# Patient Record
Sex: Female | Born: 1950
Health system: Southern US, Community
[De-identification: ages and names within clinical notes are randomized; demographics above are authoritative.]

## PROBLEM LIST (undated history)

## (undated) DIAGNOSIS — I82409 Acute embolism and thrombosis of unspecified deep veins of unspecified lower extremity: Secondary | ICD-10-CM

## (undated) DIAGNOSIS — D0359 Melanoma in situ of other part of trunk: Secondary | ICD-10-CM

## (undated) DIAGNOSIS — I10 Essential (primary) hypertension: Secondary | ICD-10-CM

## (undated) DIAGNOSIS — D219 Benign neoplasm of connective and other soft tissue, unspecified: Secondary | ICD-10-CM

## (undated) DIAGNOSIS — D4959 Neoplasm of unspecified behavior of other genitourinary organ: Secondary | ICD-10-CM

## (undated) DIAGNOSIS — C801 Malignant (primary) neoplasm, unspecified: Secondary | ICD-10-CM

## (undated) HISTORY — PX: SKIN SURGERY: SHX2413

## (undated) HISTORY — DX: Essential (primary) hypertension: I10

## (undated) HISTORY — PX: OTHER SURGICAL HISTORY: SHX169

## (undated) HISTORY — DX: Neoplasm of unspecified behavior of other genitourinary organ: D49.59

## (undated) HISTORY — PX: OVARIAN CYST REMOVAL: SHX89

## (undated) HISTORY — DX: Acute embolism and thrombosis of unspecified deep veins of unspecified lower extremity: I82.409

## (undated) HISTORY — DX: Benign neoplasm of connective and other soft tissue, unspecified: D21.9

## (undated) HISTORY — PX: FACIAL COSMETIC SURGERY: SHX629

## (undated) HISTORY — PX: BACK SURGERY: SHX140

## (undated) HISTORY — DX: Malignant (primary) neoplasm, unspecified: C80.1

## (undated) HISTORY — DX: Melanoma in situ of other part of trunk: D03.59

## (undated) HISTORY — PX: BREAST SURGERY: SHX581

---

## 1997-12-17 ENCOUNTER — Other Ambulatory Visit: Admission: RE | Admit: 1997-12-17 | Discharge: 1997-12-17 | Payer: Self-pay | Admitting: Gynecology

## 1998-10-14 ENCOUNTER — Other Ambulatory Visit: Admission: RE | Admit: 1998-10-14 | Discharge: 1998-10-14 | Payer: Self-pay | Admitting: Gynecology

## 1998-12-27 ENCOUNTER — Encounter: Payer: Self-pay | Admitting: Cardiovascular Disease

## 1998-12-27 ENCOUNTER — Ambulatory Visit (HOSPITAL_COMMUNITY): Admission: RE | Admit: 1998-12-27 | Discharge: 1998-12-27 | Payer: Self-pay | Admitting: Cardiovascular Disease

## 1999-07-01 ENCOUNTER — Encounter: Payer: Self-pay | Admitting: Gynecology

## 1999-07-01 ENCOUNTER — Ambulatory Visit (HOSPITAL_COMMUNITY): Admission: RE | Admit: 1999-07-01 | Discharge: 1999-07-01 | Payer: Self-pay | Admitting: Gynecology

## 1999-07-07 ENCOUNTER — Encounter: Payer: Self-pay | Admitting: Gynecology

## 1999-07-07 ENCOUNTER — Ambulatory Visit (HOSPITAL_COMMUNITY): Admission: RE | Admit: 1999-07-07 | Discharge: 1999-07-07 | Payer: Self-pay | Admitting: Gynecology

## 1999-08-28 ENCOUNTER — Other Ambulatory Visit: Admission: RE | Admit: 1999-08-28 | Discharge: 1999-08-28 | Payer: Self-pay | Admitting: Gynecology

## 2000-02-19 ENCOUNTER — Other Ambulatory Visit: Admission: RE | Admit: 2000-02-19 | Discharge: 2000-02-19 | Payer: Self-pay | Admitting: Gynecology

## 2000-06-17 ENCOUNTER — Ambulatory Visit (HOSPITAL_COMMUNITY): Admission: RE | Admit: 2000-06-17 | Discharge: 2000-06-17 | Payer: Self-pay | Admitting: Gynecology

## 2000-06-17 ENCOUNTER — Encounter: Payer: Self-pay | Admitting: Gynecology

## 2000-09-20 ENCOUNTER — Other Ambulatory Visit: Admission: RE | Admit: 2000-09-20 | Discharge: 2000-09-20 | Payer: Self-pay | Admitting: Gynecology

## 2001-06-20 ENCOUNTER — Encounter: Payer: Self-pay | Admitting: Gynecology

## 2001-06-20 ENCOUNTER — Ambulatory Visit (HOSPITAL_COMMUNITY): Admission: RE | Admit: 2001-06-20 | Discharge: 2001-06-20 | Payer: Self-pay | Admitting: Gynecology

## 2001-09-14 ENCOUNTER — Other Ambulatory Visit: Admission: RE | Admit: 2001-09-14 | Discharge: 2001-09-14 | Payer: Self-pay | Admitting: Gynecology

## 2002-06-23 ENCOUNTER — Ambulatory Visit (HOSPITAL_COMMUNITY): Admission: RE | Admit: 2002-06-23 | Discharge: 2002-06-23 | Payer: Self-pay | Admitting: Gynecology

## 2002-06-23 ENCOUNTER — Encounter: Payer: Self-pay | Admitting: Gynecology

## 2002-06-27 ENCOUNTER — Encounter: Admission: RE | Admit: 2002-06-27 | Discharge: 2002-06-27 | Payer: Self-pay | Admitting: Gynecology

## 2002-06-27 ENCOUNTER — Encounter: Payer: Self-pay | Admitting: Gynecology

## 2002-09-18 ENCOUNTER — Other Ambulatory Visit: Admission: RE | Admit: 2002-09-18 | Discharge: 2002-09-18 | Payer: Self-pay | Admitting: Gynecology

## 2003-07-16 ENCOUNTER — Ambulatory Visit (HOSPITAL_COMMUNITY): Admission: RE | Admit: 2003-07-16 | Discharge: 2003-07-16 | Payer: Self-pay | Admitting: Gynecology

## 2003-09-24 ENCOUNTER — Other Ambulatory Visit: Admission: RE | Admit: 2003-09-24 | Discharge: 2003-09-24 | Payer: Self-pay | Admitting: Gynecology

## 2004-07-30 ENCOUNTER — Ambulatory Visit (HOSPITAL_COMMUNITY): Admission: RE | Admit: 2004-07-30 | Discharge: 2004-07-30 | Payer: Self-pay | Admitting: Gynecology

## 2004-09-03 ENCOUNTER — Encounter: Admission: RE | Admit: 2004-09-03 | Discharge: 2004-09-03 | Payer: Self-pay | Admitting: Gynecology

## 2004-10-11 ENCOUNTER — Encounter: Admission: RE | Admit: 2004-10-11 | Discharge: 2004-10-11 | Payer: Self-pay | Admitting: Gynecology

## 2005-01-26 ENCOUNTER — Other Ambulatory Visit: Admission: RE | Admit: 2005-01-26 | Discharge: 2005-01-26 | Payer: Self-pay | Admitting: Gynecology

## 2005-04-03 ENCOUNTER — Ambulatory Visit: Payer: Self-pay | Admitting: Infectious Diseases

## 2006-08-23 ENCOUNTER — Ambulatory Visit (HOSPITAL_COMMUNITY): Admission: RE | Admit: 2006-08-23 | Discharge: 2006-08-23 | Payer: Self-pay | Admitting: Gynecology

## 2007-08-26 ENCOUNTER — Ambulatory Visit (HOSPITAL_COMMUNITY): Admission: RE | Admit: 2007-08-26 | Discharge: 2007-08-26 | Payer: Self-pay | Admitting: Gynecology

## 2008-08-27 ENCOUNTER — Ambulatory Visit (HOSPITAL_COMMUNITY): Admission: RE | Admit: 2008-08-27 | Discharge: 2008-08-27 | Payer: Self-pay | Admitting: Gynecology

## 2008-09-03 ENCOUNTER — Encounter: Admission: RE | Admit: 2008-09-03 | Discharge: 2008-09-03 | Payer: Self-pay | Admitting: Gynecology

## 2009-08-10 DIAGNOSIS — D4959 Neoplasm of unspecified behavior of other genitourinary organ: Secondary | ICD-10-CM

## 2009-08-10 HISTORY — DX: Neoplasm of unspecified behavior of other genitourinary organ: D49.59

## 2009-09-06 ENCOUNTER — Ambulatory Visit (HOSPITAL_COMMUNITY): Admission: RE | Admit: 2009-09-06 | Discharge: 2009-09-06 | Payer: Self-pay | Admitting: Gynecology

## 2009-12-27 ENCOUNTER — Encounter: Admission: RE | Admit: 2009-12-27 | Discharge: 2009-12-27 | Payer: Self-pay | Admitting: Gynecology

## 2009-12-31 ENCOUNTER — Ambulatory Visit: Admission: RE | Admit: 2009-12-31 | Discharge: 2009-12-31 | Payer: Self-pay | Admitting: Gynecologic Oncology

## 2010-01-14 ENCOUNTER — Encounter: Payer: Self-pay | Admitting: Obstetrics & Gynecology

## 2010-01-14 ENCOUNTER — Inpatient Hospital Stay (HOSPITAL_COMMUNITY): Admission: RE | Admit: 2010-01-14 | Discharge: 2010-01-17 | Payer: Self-pay | Admitting: Gynecology

## 2010-02-13 ENCOUNTER — Ambulatory Visit: Admission: RE | Admit: 2010-02-13 | Discharge: 2010-02-13 | Payer: Self-pay | Admitting: Gynecologic Oncology

## 2010-06-05 ENCOUNTER — Ambulatory Visit: Admission: RE | Admit: 2010-06-05 | Discharge: 2010-06-05 | Payer: Self-pay | Admitting: Gynecologic Oncology

## 2010-08-31 ENCOUNTER — Encounter: Payer: Self-pay | Admitting: Gynecology

## 2010-09-08 ENCOUNTER — Ambulatory Visit (HOSPITAL_COMMUNITY)
Admission: RE | Admit: 2010-09-08 | Discharge: 2010-09-08 | Payer: Self-pay | Source: Home / Self Care | Attending: Gynecology | Admitting: Gynecology

## 2010-10-27 LAB — CBC
HCT: 33.6 % — ABNORMAL LOW (ref 36.0–46.0)
HCT: 34.9 % — ABNORMAL LOW (ref 36.0–46.0)
HCT: 42.5 % (ref 36.0–46.0)
Hemoglobin: 11.8 g/dL — ABNORMAL LOW (ref 12.0–15.0)
Hemoglobin: 12.4 g/dL (ref 12.0–15.0)
Hemoglobin: 15 g/dL (ref 12.0–15.0)
MCHC: 34.9 g/dL (ref 30.0–36.0)
MCHC: 35.4 g/dL (ref 30.0–36.0)
MCHC: 35.6 g/dL (ref 30.0–36.0)
MCV: 87.2 fL (ref 78.0–100.0)
MCV: 87.6 fL (ref 78.0–100.0)
MCV: 87.9 fL (ref 78.0–100.0)
Platelets: 168 10*3/uL (ref 150–400)
Platelets: 195 10*3/uL (ref 150–400)
Platelets: 195 10*3/uL (ref 150–400)
RBC: 3.83 MIL/uL — ABNORMAL LOW (ref 3.87–5.11)
RBC: 3.98 MIL/uL (ref 3.87–5.11)
RBC: 4.87 MIL/uL (ref 3.87–5.11)
RDW: 11.6 % (ref 11.5–15.5)
RDW: 11.8 % (ref 11.5–15.5)
RDW: 12 % (ref 11.5–15.5)
WBC: 12.9 10*3/uL — ABNORMAL HIGH (ref 4.0–10.5)
WBC: 21.6 10*3/uL — ABNORMAL HIGH (ref 4.0–10.5)
WBC: 7.8 10*3/uL (ref 4.0–10.5)

## 2010-10-27 LAB — COMPREHENSIVE METABOLIC PANEL
ALT: 19 U/L (ref 0–35)
AST: 17 U/L (ref 0–37)
Albumin: 4.6 g/dL (ref 3.5–5.2)
Alkaline Phosphatase: 78 U/L (ref 39–117)
BUN: 14 mg/dL (ref 6–23)
CO2: 27 mEq/L (ref 19–32)
Calcium: 9.4 mg/dL (ref 8.4–10.5)
Chloride: 104 mEq/L (ref 96–112)
Creatinine, Ser: 0.77 mg/dL (ref 0.4–1.2)
GFR calc Af Amer: >60 mL/min (ref >60)
GFR calc non Af Amer: >60 mL/min (ref >60)
Glucose, Bld: 108 mg/dL — ABNORMAL HIGH (ref 70–99)
Potassium: 3.6 mEq/L (ref 3.5–5.1)
Sodium: 139 mEq/L (ref 135–145)
Total Bilirubin: 1.1 mg/dL (ref 0.3–1.2)
Total Protein: 7.7 g/dL (ref 6.0–8.3)

## 2010-10-27 LAB — BASIC METABOLIC PANEL
BUN: 7 mg/dL (ref 6–23)
BUN: 8 mg/dL (ref 6–23)
CO2: 27 mEq/L (ref 19–32)
CO2: 27 mEq/L (ref 19–32)
Calcium: 8.9 mg/dL (ref 8.4–10.5)
Calcium: 9 mg/dL (ref 8.4–10.5)
Chloride: 107 mEq/L (ref 96–112)
Chloride: 108 mEq/L (ref 96–112)
Creatinine, Ser: 0.78 mg/dL (ref 0.4–1.2)
Creatinine, Ser: 0.88 mg/dL (ref 0.4–1.2)
GFR calc Af Amer: >60 mL/min (ref >60)
GFR calc Af Amer: >60 mL/min (ref >60)
GFR calc non Af Amer: >60 mL/min (ref >60)
GFR calc non Af Amer: >60 mL/min (ref >60)
Glucose, Bld: 114 mg/dL — ABNORMAL HIGH (ref 70–99)
Glucose, Bld: 177 mg/dL — ABNORMAL HIGH (ref 70–99)
Potassium: 3.8 mEq/L (ref 3.5–5.1)
Potassium: 4.8 mEq/L (ref 3.5–5.1)
Sodium: 139 mEq/L (ref 135–145)
Sodium: 140 mEq/L (ref 135–145)

## 2010-10-27 LAB — DIFFERENTIAL
Basophils Absolute: 0.1 10*3/uL (ref 0.0–0.1)
Basophils Relative: 1 % (ref 0–1)
Eosinophils Absolute: 0.1 10*3/uL (ref 0.0–0.7)
Eosinophils Relative: 1 % (ref 0–5)
Lymphocytes Relative: 43 % (ref 12–46)
Lymphs Abs: 3.3 10*3/uL (ref 0.7–4.0)
Monocytes Absolute: 0.6 10*3/uL (ref 0.1–1.0)
Monocytes Relative: 7 % (ref 3–12)
Neutro Abs: 3.8 10*3/uL (ref 1.7–7.7)
Neutrophils Relative %: 48 % (ref 43–77)

## 2010-10-27 LAB — ABO/RH: ABO/RH(D): O POS

## 2010-10-27 LAB — TYPE AND SCREEN
ABO/RH(D): O POS
Antibody Screen: NEGATIVE

## 2010-12-29 ENCOUNTER — Other Ambulatory Visit: Payer: Self-pay | Admitting: Gynecology

## 2011-07-22 ENCOUNTER — Ambulatory Visit: Payer: BC Managed Care – PPO | Attending: Gynecologic Oncology | Admitting: Gynecologic Oncology

## 2011-07-22 ENCOUNTER — Encounter: Payer: Self-pay | Admitting: Gynecologic Oncology

## 2011-07-22 VITALS — BP 114/52 | HR 62 | Temp 98.1°F | Resp 16 | Ht 60.83 in | Wt 156.3 lb

## 2011-07-22 DIAGNOSIS — D3910 Neoplasm of uncertain behavior of unspecified ovary: Secondary | ICD-10-CM

## 2011-07-22 DIAGNOSIS — Z9079 Acquired absence of other genital organ(s): Secondary | ICD-10-CM | POA: Insufficient documentation

## 2011-07-22 DIAGNOSIS — Z79899 Other long term (current) drug therapy: Secondary | ICD-10-CM | POA: Insufficient documentation

## 2011-07-22 DIAGNOSIS — D279 Benign neoplasm of unspecified ovary: Secondary | ICD-10-CM | POA: Insufficient documentation

## 2011-07-22 DIAGNOSIS — Z90710 Acquired absence of both cervix and uterus: Secondary | ICD-10-CM | POA: Insufficient documentation

## 2011-07-22 DIAGNOSIS — Z85820 Personal history of malignant melanoma of skin: Secondary | ICD-10-CM | POA: Insufficient documentation

## 2011-07-22 DIAGNOSIS — I10 Essential (primary) hypertension: Secondary | ICD-10-CM | POA: Insufficient documentation

## 2011-07-22 NOTE — Progress Notes (Signed)
Consult Note: Gyn-Onc  Kristina Whitaker 60 y.o. female  CC:  Chief Complaint  Patient presents with  . Follow-up    LMP tumor of Ovary    HPI: This is a 60 year old who is referred to [place] by Dr. Greta Doom for pelvic mass which was evaluated and revealed a 10 cm solid mass noted on imaging. On 01/14/2010, she underwent total abdominal hysterectomy bilateral salpingo-oophorectomy and omentectomy. 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  We last saw her October of 2011. Her exam at that time was negative. Her CA 125 is not informative. She cytisine note in August of 2012 stating that there was a 2 mm spot in her lungs. She had a CT scan of the chest done as her husband has been diagnosed with lung cancer that was operable and she has a personal history of melanoma. The decision was to follow this up with a CT scan in 6 months. She is currently scheduled for CT scan of February 2013. This will be followed by her local physician and she will have the report sent to [place]. Interval History:  She continues to be very busy and traveling to [place] in [place]. About 2 weeks ago she was diagnosed with hypertension. He needed to call the ambulance for her place of employment clinic. She states that she has a lot of stress at work. Since that time she has been exercising more she is walking about a mile a day she is eating better and she feels that she has lost about 5 pounds and feels that her blood pressure is markedly improved. She has a -10 point review of systems. Review of Systems She denies any chest pain shortness of breath nausea vomiting fevers chills headaches visual changes. Denies any unintentional weight loss or weight gain. Any change in bowel bladder habits or early satiety. Current Meds:  Outpatient Encounter Prescriptions as of 07/22/2011  Medication Sig Dispense Refill  . fish oil-omega-3 fatty acids 1000 MG  capsule Take 1,000 mg by mouth daily.        . rosuvastatin (CRESTOR) 10 MG tablet Take 10 mg by mouth daily.          Allergy:  Allergies  Allergen Reactions  . Penicillins Hives  . Sulfa Drugs Cross Reactors Hives    Social Hx:   History   Social History  . Marital Status: Married    Spouse Name: N/A    Number of Children: N/A  . Years of Education: N/A   Occupational History  . Not on file.   Social History Main Topics  . Smoking status: Never Smoker   . Smokeless tobacco: Not on file  . Alcohol Use: 0.6 oz/week    1 Glasses of wine per week  . Drug Use:   . Sexually Active: No   Other Topics Concern  . Not on file   Social History Narrative  . No narrative on file    Past Surgical Hx:  Past Surgical History  Procedure Date  . Facial cosmetic surgery     2006  . Ovarian cyst removal     x2 with rupture x2 in her 60 and 40s  . Breast surgery     bilateral fibroadenomas/ cyst aspiration  . Back surgery     melanoma exicision from back    Past Medical Hx:  Past Medical History  Diagnosis Date  . Fibroids  uterine around age 60  . Melanoma in situ of back     1991  . Ovarian tumor 2011    LMP  . Hypertension   . Cancer     Family Hx:  Family History  Problem Relation Age of Onset  . Stroke Father   . Heart attack Sister   . Stroke Sister   . Heart attack Brother   . Heart attack Maternal Aunt   . Heart attack Paternal Uncle     Vitals:  Blood pressure 114/52, pulse 62, temperature 98.1 F (36.7 C), temperature source Oral, resp. rate 16, height 5' 0.83" (1.545 m), weight 156 lb 4.8 oz (70.897 kg).  Physical Exam: Well-nourished well-developed female in no acute distress. Neck is supple there is no lymphadenopathy no thyromegaly.  Lungs: Clear to auscultation bilaterally.  Cardiovascular exam: Regular rate and rhythm.  Abdomen: Well-healed vertical midline incision. There is no evidence of an incisional hernia. Abdomen is soft,  nontender, there are no palpable masses.  Groins: There is no lymphadenopathy.  Pelvic: External genitalia within normal limits. Bimanual examination reveals no masses or nodularity. Rectal confirms.   Extremities: No edema.  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 She'll followup with her primary physician in 6 months and she'll return to see [place] in one year. We have asked her to forward the note of her CT scan results from February to [place] so we can her file complete. She is not really sure what the most accurate medications are and she has promised to contact us with her medication list which she gets a  Ceferino Lang A., MD 07/22/2011, 3:16 PM

## 2011-07-22 NOTE — Patient Instructions (Signed)
RTC one year

## 2011-08-24 ENCOUNTER — Other Ambulatory Visit (HOSPITAL_COMMUNITY): Payer: Self-pay | Admitting: Gynecology

## 2011-08-24 DIAGNOSIS — Z1231 Encounter for screening mammogram for malignant neoplasm of breast: Secondary | ICD-10-CM

## 2011-10-05 ENCOUNTER — Ambulatory Visit (HOSPITAL_COMMUNITY)
Admission: RE | Admit: 2011-10-05 | Discharge: 2011-10-05 | Disposition: A | Discharge status: Home / Self Care | Payer: BC Managed Care – PPO | Source: Ambulatory Visit | Attending: Gynecology | Admitting: Gynecology

## 2011-10-05 DIAGNOSIS — Z1231 Encounter for screening mammogram for malignant neoplasm of breast: Secondary | ICD-10-CM | POA: Insufficient documentation

## 2011-12-08 ENCOUNTER — Other Ambulatory Visit: Payer: Self-pay | Admitting: *Deleted

## 2011-12-08 DIAGNOSIS — Z1211 Encounter for screening for malignant neoplasm of colon: Secondary | ICD-10-CM

## 2011-12-21 ENCOUNTER — Ambulatory Visit
Admission: RE | Admit: 2011-12-21 | Discharge: 2011-12-21 | Disposition: A | Discharge status: Home / Self Care | Payer: BC Managed Care – PPO | Source: Ambulatory Visit | Attending: Physician Assistant | Admitting: Physician Assistant

## 2011-12-21 DIAGNOSIS — Z1211 Encounter for screening for malignant neoplasm of colon: Secondary | ICD-10-CM

## 2012-09-09 ENCOUNTER — Other Ambulatory Visit (HOSPITAL_COMMUNITY): Payer: Self-pay | Admitting: Gynecology

## 2012-09-09 DIAGNOSIS — Z1231 Encounter for screening mammogram for malignant neoplasm of breast: Secondary | ICD-10-CM

## 2012-10-07 ENCOUNTER — Ambulatory Visit (HOSPITAL_COMMUNITY)
Admission: RE | Admit: 2012-10-07 | Discharge: 2012-10-07 | Disposition: A | Discharge status: Home / Self Care | Payer: BC Managed Care – PPO | Source: Ambulatory Visit | Attending: Gynecology | Admitting: Gynecology

## 2012-10-07 DIAGNOSIS — Z1231 Encounter for screening mammogram for malignant neoplasm of breast: Secondary | ICD-10-CM | POA: Insufficient documentation

## 2012-11-10 ENCOUNTER — Other Ambulatory Visit: Payer: Self-pay | Admitting: Nurse Practitioner

## 2012-11-12 ENCOUNTER — Other Ambulatory Visit: Payer: Self-pay | Admitting: Nurse Practitioner

## 2013-01-26 ENCOUNTER — Other Ambulatory Visit: Payer: Self-pay | Admitting: Nurse Practitioner

## 2013-01-27 NOTE — Telephone Encounter (Signed)
Last seen [facility] 11/18/11

## 2013-02-21 ENCOUNTER — Other Ambulatory Visit: Payer: Self-pay | Admitting: Nurse Practitioner

## 2013-06-12 ENCOUNTER — Other Ambulatory Visit: Payer: Self-pay | Admitting: Nurse Practitioner

## 2013-07-31 ENCOUNTER — Other Ambulatory Visit: Payer: Self-pay | Admitting: Nurse Practitioner

## 2013-08-10 DIAGNOSIS — I82409 Acute embolism and thrombosis of unspecified deep veins of unspecified lower extremity: Secondary | ICD-10-CM

## 2013-08-10 HISTORY — DX: Acute embolism and thrombosis of unspecified deep veins of unspecified lower extremity: I82.409

## 2013-09-25 ENCOUNTER — Other Ambulatory Visit: Payer: Self-pay | Admitting: Nurse Practitioner

## 2014-01-25 ENCOUNTER — Other Ambulatory Visit (HOSPITAL_COMMUNITY): Payer: Self-pay | Admitting: Gynecology

## 2014-01-25 DIAGNOSIS — Z1231 Encounter for screening mammogram for malignant neoplasm of breast: Secondary | ICD-10-CM

## 2014-01-26 ENCOUNTER — Ambulatory Visit (HOSPITAL_COMMUNITY)
Admission: RE | Admit: 2014-01-26 | Discharge: 2014-01-26 | Disposition: A | Discharge status: Home / Self Care | Payer: BC Managed Care – PPO | Source: Ambulatory Visit | Attending: Gynecology | Admitting: Gynecology

## 2014-01-26 DIAGNOSIS — Z1231 Encounter for screening mammogram for malignant neoplasm of breast: Secondary | ICD-10-CM | POA: Insufficient documentation

## 2014-09-24 ENCOUNTER — Ambulatory Visit: Payer: Self-pay | Admitting: Gynecologic Oncology

## 2014-10-03 ENCOUNTER — Ambulatory Visit: Payer: No Typology Code available for payment source | Attending: Gynecologic Oncology | Admitting: Gynecologic Oncology

## 2014-10-03 ENCOUNTER — Encounter: Payer: Self-pay | Admitting: Gynecologic Oncology

## 2014-10-03 ENCOUNTER — Ambulatory Visit: Payer: PRIVATE HEALTH INSURANCE

## 2014-10-03 ENCOUNTER — Telehealth: Payer: Self-pay | Admitting: *Deleted

## 2014-10-03 VITALS — BP 145/59 | HR 78 | Temp 98.0°F | Resp 20 | Ht 60.0 in | Wt 154.7 lb

## 2014-10-03 DIAGNOSIS — I10 Essential (primary) hypertension: Secondary | ICD-10-CM | POA: Insufficient documentation

## 2014-10-03 DIAGNOSIS — R600 Localized edema: Secondary | ICD-10-CM | POA: Diagnosis not present

## 2014-10-03 DIAGNOSIS — D495 Neoplasm of unspecified behavior of other genitourinary organs: Secondary | ICD-10-CM | POA: Insufficient documentation

## 2014-10-03 DIAGNOSIS — D3912 Neoplasm of uncertain behavior of left ovary: Secondary | ICD-10-CM | POA: Diagnosis not present

## 2014-10-03 DIAGNOSIS — D3910 Neoplasm of uncertain behavior of unspecified ovary: Secondary | ICD-10-CM

## 2014-10-03 DIAGNOSIS — Z86711 Personal history of pulmonary embolism: Secondary | ICD-10-CM

## 2014-10-03 DIAGNOSIS — Z86718 Personal history of other venous thrombosis and embolism: Secondary | ICD-10-CM

## 2014-10-03 DIAGNOSIS — R911 Solitary pulmonary nodule: Secondary | ICD-10-CM

## 2014-10-03 DIAGNOSIS — E785 Hyperlipidemia, unspecified: Secondary | ICD-10-CM | POA: Insufficient documentation

## 2014-10-03 DIAGNOSIS — R079 Chest pain, unspecified: Secondary | ICD-10-CM | POA: Insufficient documentation

## 2014-10-03 DIAGNOSIS — I82402 Acute embolism and thrombosis of unspecified deep veins of left lower extremity: Secondary | ICD-10-CM

## 2014-10-03 DIAGNOSIS — I82409 Acute embolism and thrombosis of unspecified deep veins of unspecified lower extremity: Secondary | ICD-10-CM | POA: Insufficient documentation

## 2014-10-03 NOTE — Telephone Encounter (Signed)
Received phone call from patient after her office visit today. Patient states "I'm not giving my blood away so cancel my CT scan." Patient refused to have BMET drawn prior to scan. Reiterated to patient why we needed lab test done prior to scan. She states "It doesn't matter, I am not doing the scan anyway." Attempted to tell the patient to please call our clinic back if she changes her mind - patient did not comment back and hung up the phone.  CT scan cancelled.

## 2014-10-03 NOTE — Progress Notes (Signed)
Kristina Whitaker 64 y.o. Female here for followup of her IA LMP of the left ovary  CC:  Chief Complaint  Patient presents with  . Follow-up    LMP tumor of Ovary    HPI: This is a 64 year old who was originally referred to [place] by Dr. Gertie Fey for pelvic mass which was evaluated and revealed a 10 cm solid mass noted on imaging. She has not seen [place] since 07/2011.  On 01/14/2010, she underwent total abdominal hysterectomy bilateral salpingo-oophorectomy and omentectomy. 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 At the time of her last exam in December, 2012 her exam was negative. Her CA 125 is not informative.   There is mention in the notes at that time that in August of 2012 she reported having a CT of the chest that demonstrated was a 2 mm spot in her lungs. She had a CT scan of the chest done as her husband has been diagnosed with lung cancer that was operable and she has a personal history of melanoma. The decision was to follow this up with a CT scan in 6 months. Dr Alycia Rossetti had mentioned that she was scheduled for CT scan of February 2013. However, the patient strongly refutes this history to me today and reports that she has never had a CT of the chest. She is deeply anxious regarding my report of the 65mm nodule on the lung.    Interval History:  She continues to be very busy and traveling internationally and nationally. She does report having a diagnosis of a left DVT and pulmonary embolism in 2015 that was treated with Xarelto for 6 months. She is now no longer on Xarelto. She reports will 41 week having increased left lower extremity edema but to her feels consistent with a new blood clot. She went to [facility] for duplex imaging of the lower extremity which the patient reports was negative for a blood clot this to placed last week. I do not have the report from this imaging. She does report no  abdominal bloating (other than when she eats certain foods such as sugar) but does report normal bowel habit. She denies new abdominal pelvic pains.  Review of Systems She denies any chest pain shortness of breath nausea vomiting fevers chills headaches visual changes. Denies any unintentional weight loss or weight gain. Any change in bowel bladder habits or early satiety. Current Meds:  Outpatient Encounter Prescriptions as of 07/22/2011  Medication Sig Dispense Refill  . fish oil-omega-3 fatty acids 1000 MG capsule Take 1,000 mg by mouth daily.     . rosuvastatin (CRESTOR) 10 MG tablet Take 10 mg by mouth daily.       Allergy:  Allergies  Allergen Reactions  . Penicillins Hives  . Sulfa Drugs Cross Reactors Hives    Social Hx:  History   Social History  . Marital Status: Married    Spouse Name: N/A    Number of Children: N/A  . Years of Education: N/A   Occupational History  . Not on file.   Social History Main Topics  . Smoking status: Never Smoker   . Smokeless tobacco: Not on file  . Alcohol Use: 0.6 oz/week    1 Glasses of wine per week  . Drug Use:   . Sexually Active: No   Other Topics Concern  . Not on file   Social History Narrative  .  No narrative on file    Past Surgical Hx:  Past Surgical History  Procedure Date  . Facial cosmetic surgery     2006  . Ovarian cyst removal     x2 with rupture x2 in her 110s and 65s  . Breast surgery     bilateral fibroadenomas/ cyst aspiration  . Back surgery     melanoma exicision from back    Past Medical Hx:  Past Medical History  Diagnosis Date  . Fibroids     uterine around age 46  . Melanoma in situ of back     1991  . Ovarian tumor 2011    LMP  . Hypertension   . Cancer     Family Hx:  Family History  Problem Relation Age of Onset  . Stroke Father    . Heart attack Sister   . Stroke Sister   . Heart attack Brother   . Heart attack Maternal Aunt   . Heart attack Paternal Uncle     Vitals: Blood pressure 114/52, pulse 62, temperature 98.1 F (36.7 C), temperature source Oral, resp. rate 16, height 5' 0.83" (1.545 m), weight 156 lb 4.8 oz (70.897 kg).  Physical Exam: Well-nourished well-developed female in no acute distress. Neck is supple there is no lymphadenopathy no thyromegaly.  Lungs: Clear to auscultation bilaterally.  Cardiovascular exam: Regular rate and rhythm.  Abdomen: Well-healed vertical midline incision. There is no evidence of an incisional hernia. Abdomen is soft, nontender, there are no palpable masses.  Groins: There is no lymphadenopathy.  Pelvic: External genitalia within normal limits. Bimanual examination reveals no masses or nodularity. Rectal confirms.  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   She'll followup with [place] in one year.  I recommended and ordered a Chest CT to followup the questionable report of a 47mm nodule. I also recommended and ordered a CT of the abdomen and pelvis to look for a pelvic venous clot (or mass causing compression of the LLE venous system). However, the patient declined pursuing this after she found out that she would need blood to be drawn prior to the CT.   Donaciano Eva, MD

## 2014-10-03 NOTE — Patient Instructions (Signed)
We will call you CT scan results.  Followup with GYN Oncology in 1 year (February 2017). Please call us several months before to schedule this please.

## 2014-10-05 ENCOUNTER — Ambulatory Visit (HOSPITAL_COMMUNITY): Payer: PRIVATE HEALTH INSURANCE

## 2014-10-09 ENCOUNTER — Telehealth: Payer: Self-pay | Admitting: *Deleted

## 2014-10-09 NOTE — Telephone Encounter (Signed)
Received call from patient stating that she does now remember having a CT scan several years ago and that she was made aware of the 4mm lung nodule. She states the scan was ordered by her dermatologist, Dr. Deirdre Peer. Patient states she would like to proceed with the CT scan now but is requesting to have it without contrast. Patient states "The contrast makes me nauseated." Patient would not confirm if she has a true allergy to contrast or if it simply made her stomach slightly upset. Told patient that I will pass this information along to Dr. Denman George and that MD would have to decide if it is okay to proceed with the CT scan without contrast. Told patient I would return her call once this information is reviewed by Dr. Denman George.  Per Dr. Denman George, it is fine for patient to have the CT without contrast but that the patient needs to understand that without contrast the findings are typically much more limited.   Called and left VM for patient with information noted above and requested a return call.

## 2015-02-04 ENCOUNTER — Other Ambulatory Visit: Payer: Self-pay

## 2015-03-28 ENCOUNTER — Other Ambulatory Visit: Payer: Self-pay | Admitting: Nurse Practitioner

## 2015-03-28 DIAGNOSIS — Z1231 Encounter for screening mammogram for malignant neoplasm of breast: Secondary | ICD-10-CM

## 2015-03-29 ENCOUNTER — Ambulatory Visit (HOSPITAL_COMMUNITY)
Admission: RE | Admit: 2015-03-29 | Discharge: 2015-03-29 | Disposition: A | Discharge status: Home / Self Care | Payer: Medicare (Managed Care) | Source: Ambulatory Visit | Attending: Nurse Practitioner | Admitting: Nurse Practitioner

## 2015-03-29 DIAGNOSIS — Z1231 Encounter for screening mammogram for malignant neoplasm of breast: Secondary | ICD-10-CM

## 2015-04-02 ENCOUNTER — Other Ambulatory Visit: Payer: Self-pay | Admitting: Nurse Practitioner

## 2015-04-02 DIAGNOSIS — R928 Other abnormal and inconclusive findings on diagnostic imaging of breast: Secondary | ICD-10-CM

## 2015-04-04 ENCOUNTER — Other Ambulatory Visit: Payer: Self-pay | Admitting: Nurse Practitioner

## 2015-04-04 DIAGNOSIS — R928 Other abnormal and inconclusive findings on diagnostic imaging of breast: Secondary | ICD-10-CM

## 2015-04-08 ENCOUNTER — Ambulatory Visit
Admission: RE | Admit: 2015-04-08 | Discharge: 2015-04-08 | Disposition: A | Discharge status: Home / Self Care | Payer: PRIVATE HEALTH INSURANCE | Source: Ambulatory Visit | Attending: Nurse Practitioner | Admitting: Nurse Practitioner

## 2015-04-08 DIAGNOSIS — R928 Other abnormal and inconclusive findings on diagnostic imaging of breast: Secondary | ICD-10-CM

## 2015-07-06 ENCOUNTER — Encounter: Payer: Self-pay | Admitting: Family Medicine

## 2015-07-06 ENCOUNTER — Ambulatory Visit (INDEPENDENT_AMBULATORY_CARE_PROVIDER_SITE_OTHER): Payer: PRIVATE HEALTH INSURANCE | Admitting: Family Medicine

## 2015-07-06 VITALS — BP 161/80 | HR 73 | Temp 98.0°F | Ht 60.0 in | Wt 154.0 lb

## 2015-07-06 DIAGNOSIS — H5789 Other specified disorders of eye and adnexa: Secondary | ICD-10-CM

## 2015-07-06 DIAGNOSIS — H578 Other specified disorders of eye and adnexa: Secondary | ICD-10-CM | POA: Diagnosis not present

## 2015-07-06 NOTE — Progress Notes (Signed)
BP 161/80 mmHg  Pulse 73  Temp(Src) 98 F (36.7 C) (Oral)  Ht 5' (1.524 m)  Wt 154 lb (69.854 kg)  BMI 30.08 kg/m2   Subjective:    Patient ID: Kristina Whitaker, female    DOB: 05-Oct-1950, 64 y.o.   MRN: [id number]  HPI: Kristina Whitaker is a 64 y.o. female presenting on 07/06/2015 for Eye Injury   HPI Eye irritation Patient was doing makeup this morning and feels like she poked herself in the right eye with her eyeliner pencil. She is concerned she may have cut into her eye. She denies any blurred vision with that eye. She denies any drainage. She does have a little redness on the cornea of the eye but otherwise no major issue.  Relevant past medical, surgical, family and social history reviewed and updated as indicated. Interim medical history since our last visit reviewed. Allergies and medications reviewed and updated.  Review of Systems  Constitutional: Negative for fever and chills.  HENT: Negative for congestion, ear discharge and ear pain.   Eyes: Positive for pain and redness. Negative for photophobia, discharge and visual disturbance.  Respiratory: Negative for chest tightness and shortness of breath.   Cardiovascular: Negative for chest pain and leg swelling.  Genitourinary: Negative for dysuria and difficulty urinating.  Musculoskeletal: Negative for back pain and gait problem.  Skin: Negative for rash.  Neurological: Negative for light-headedness and headaches.  Psychiatric/Behavioral: Negative for behavioral problems and agitation.  All other systems reviewed and are negative.   Per HPI unless specifically indicated above     Medication List       This list is accurate as of: 07/06/15 10:07 AM.  Always use your most recent med list.               aspirin 81 MG tablet  Take 162 mg by mouth daily.     fish oil-omega-3 fatty acids 1000 MG capsule  Take 1,000 mg by mouth daily.     lisinopril 20 MG tablet  Commonly known as:  PRINIVIL,ZESTRIL  Take 20 mg  by mouth daily.     rosuvastatin 10 MG tablet  Commonly known as:  CRESTOR  Take 10 mg by mouth daily.          Objective:    BP 161/80 mmHg  Pulse 73  Temp(Src) 98 F (36.7 C) (Oral)  Ht 5' (1.524 m)  Wt 154 lb (69.854 kg)  BMI 30.08 kg/m2  Wt Readings from Last 3 Encounters:  07/06/15 154 lb (69.854 kg)  10/03/14 154 lb 11.2 oz (70.171 kg)  07/22/11 156 lb 4.8 oz (70.897 kg)    Physical Exam  Constitutional: She is oriented to person, place, and time. She appears well-developed and well-nourished. No distress.  Eyes: EOM and lids are normal. Pupils are equal, round, and reactive to light. Lids are everted and swept, no foreign bodies found. Right eye exhibits no chemosis, no discharge and no exudate. No foreign body present in the right eye. Right conjunctiva is injected. Right conjunctiva has no hemorrhage. No scleral icterus.  Used Fluorescein staining and blue light and no major ulcerations or foreign bodies are noted.  Cardiovascular: Normal rate, regular rhythm, normal heart sounds and intact distal pulses.   No murmur heard. Pulmonary/Chest: Effort normal and breath sounds normal. No respiratory distress. She has no wheezes.  Musculoskeletal: Normal range of motion. She exhibits no edema or tenderness.  Neurological: She is alert and oriented to person, place,  and time. Coordination normal.  Skin: Skin is warm and dry. No rash noted. She is not diaphoretic.  Psychiatric: She has a normal mood and affect. Her behavior is normal.  Nursing note and vitals reviewed.       Assessment & Plan:       Problem List Items Addressed This Visit    None    Visit Diagnoses    Irritation of right eye    -  Primary    She feels she may have cut her eyes some because of her makeup and has some irritation. Uses fluorescent lighting and stain and do not see any major ulcerations        Follow up plan: Return if symptoms worsen or fail to improve.  Counseling provided for  all of the vaccine components No orders of the defined types were placed in this encounter.    Caryl Pina, MD [facility] 07/06/2015, 10:07 AM

## 2015-11-14 ENCOUNTER — Other Ambulatory Visit: Payer: Self-pay | Admitting: Obstetrics & Gynecology

## 2015-11-14 DIAGNOSIS — E2839 Other primary ovarian failure: Secondary | ICD-10-CM

## 2016-02-06 ENCOUNTER — Other Ambulatory Visit (HOSPITAL_COMMUNITY): Payer: Self-pay | Admitting: Physician Assistant

## 2016-02-06 DIAGNOSIS — Z1231 Encounter for screening mammogram for malignant neoplasm of breast: Secondary | ICD-10-CM

## 2016-04-09 ENCOUNTER — Ambulatory Visit (HOSPITAL_COMMUNITY)
Admission: RE | Admit: 2016-04-09 | Discharge: 2016-04-09 | Disposition: A | Discharge status: Home / Self Care | Payer: 59 | Source: Ambulatory Visit | Attending: Physician Assistant | Admitting: Physician Assistant

## 2016-04-09 DIAGNOSIS — Z1231 Encounter for screening mammogram for malignant neoplasm of breast: Secondary | ICD-10-CM | POA: Diagnosis not present

## 2017-02-25 ENCOUNTER — Ambulatory Visit (INDEPENDENT_AMBULATORY_CARE_PROVIDER_SITE_OTHER): Payer: Medicare Other | Admitting: Family

## 2017-02-25 ENCOUNTER — Encounter: Payer: Self-pay | Admitting: Family

## 2017-02-25 VITALS — BP 153/71 | HR 70 | Temp 98.2°F | Ht 60.0 in | Wt 155.8 lb

## 2017-02-25 DIAGNOSIS — I10 Essential (primary) hypertension: Secondary | ICD-10-CM

## 2017-02-25 DIAGNOSIS — H9192 Unspecified hearing loss, left ear: Secondary | ICD-10-CM | POA: Diagnosis not present

## 2017-02-25 NOTE — Progress Notes (Signed)
   Subjective:    Patient ID: Kristina Whitaker, female    DOB: Jun 16, 1951, 66 y.o.   MRN: [id number]  HPI Pt presents to the office today with decreased hearing in left ear that started in 2006 after her "String surgery". Pt is worried about cerumen impaction.    Review of Systems  HENT: Positive for hearing loss.   All other systems reviewed and are negative.  Family History  Problem Relation Age of Onset  . Heart attack Sister   . Stroke Sister   . Heart attack Maternal Aunt   . Heart attack Paternal Uncle   . Stroke Father   . Heart attack Brother    Social History   Social History  . Marital status: Married    Spouse name: N/A  . Number of children: N/A  . Years of education: N/A   Social History Main Topics  . Smoking status: Never Smoker  . Smokeless tobacco: Never Used  . Alcohol use 0.6 oz/week    1 Glasses of wine per week  . Drug use: No  . Sexual activity: No   Other Topics Concern  . None   Social History Narrative  . None       Objective:   Physical Exam  Constitutional: She is oriented to person, place, and time. She appears well-developed and well-nourished. No distress.  HENT:  Head: Normocephalic and atraumatic.  Right Ear: External ear normal. No drainage, swelling or tenderness. Tympanic membrane is perforated. Tympanic membrane is not erythematous and not retracted.  Left Ear: External ear normal. No drainage, swelling or tenderness. Tympanic membrane is not erythematous and not retracted.  Mouth/Throat: Oropharynx is clear and moist.  Eyes: Pupils are equal, round, and reactive to light.  Neck: Normal range of motion. Neck supple. No thyromegaly present.  Cardiovascular: Normal rate, regular rhythm, normal heart sounds and intact distal pulses.   No murmur heard. Pulmonary/Chest: Effort normal and breath sounds normal. No respiratory distress. She has no wheezes.  Abdominal: Soft. Bowel sounds are normal. She exhibits no distension. There is  no tenderness.  Musculoskeletal: Normal range of motion. She exhibits no edema or tenderness.  Neurological: She is alert and oriented to person, place, and time.  Skin: Skin is warm and dry.  Psychiatric: She has a normal mood and affect. Her behavior is normal. Judgment and thought content normal.  Vitals reviewed.    BP (!) 153/71   Pulse 70   Temp 98.2 F (36.8 C) (Oral)   Ht 5' (1.524 m)   Wt 155 lb 12.8 oz (70.7 kg)   BMI 30.43 kg/m      Assessment & Plan:  1. Decreased hearing of left ear Avoid loud noises Referral pending - Ambulatory referral to Audiology  2. Essential hypertension Pt states she was "arguing with her niece" and her BP is usually normal Discussed keeping close attention report >140/90 Low salt diet  Evelina Dun, FNP     Evelina Dun, [place]

## 2017-02-25 NOTE — Patient Instructions (Signed)
Hearing Loss Hearing loss is a partial or total loss of the ability to hear. This can be temporary or permanent, and it can happen in one or both ears. Hearing loss may be referred to as deafness. Medical care is necessary to treat hearing loss properly and to prevent the condition from getting worse. Your hearing may partially or completely come back, depending on what caused your hearing loss and how severe it is. In some cases, hearing loss is permanent. What are the causes? Common causes of hearing loss include:  Too much wax in the ear canal.  Infection of the ear canal or middle ear.  Fluid in the middle ear.  Injury to the ear or surrounding area.  An object stuck in the ear.  Prolonged exposure to loud sounds, such as music.  Less common causes of hearing loss include:  Tumors in the ear.  Viral or bacterial infections, such as meningitis.  A hole in the eardrum (perforated eardrum).  Problems with the hearing nerve that sends signals between the brain and the ear.  Certain medicines.  What are the signs or symptoms? Symptoms of this condition may include:  Difficulty telling the difference between sounds.  Difficulty following a conversation when there is background noise.  Lack of response to sounds in your environment. This may be most noticeable when you do not respond to startling sounds.  Needing to turn up the volume on the television, radio, etc.  Ringing in the ears.  Dizziness.  Pain in the ears.  How is this diagnosed? This condition is diagnosed based on a physical exam and a hearing test (audiometry). The audiometry test will be performed by a hearing specialist (audiologist). You may also be referred to an ear, nose, and throat (ENT) specialist (otolaryngologist). How is this treated? Treatment for recent onset of hearing loss may include:  Ear wax removal.  Being prescribed medicines to prevent infection (antibiotics).  Being prescribed  medicines to reduce inflammation (corticosteroids).  Follow these instructions at home:  If you were prescribed an antibiotic medicine, take it as told by your health care provider. Do not stop taking the antibiotic even if you start to feel better.  Take over-the-counter and prescription medicines only as told by your health care provider.  Avoid loud noises.  Return to your normal activities as told by your health care provider. Ask your health care provider what activities are safe for you.  Keep all follow-up visits as told by your health care provider. This is important. Contact a health care provider if:  You feel dizzy.  You develop new symptoms.  You vomit or feel nauseous.  You have a fever. Get help right away if:  You develop sudden changes in your vision.  You have severe ear pain.  You have new or increased weakness.  You have a severe headache. This information is not intended to replace advice given to you by your health care provider. Make sure you discuss any questions you have with your health care provider. Document Released: 07/27/2005 Document Revised: 01/02/2016 Document Reviewed: 12/12/2014 Elsevier Interactive Patient Education  2018 [employer]  

## 2017-04-03 ENCOUNTER — Encounter: Payer: Self-pay | Admitting: Physician Assistant

## 2017-04-03 ENCOUNTER — Ambulatory Visit (INDEPENDENT_AMBULATORY_CARE_PROVIDER_SITE_OTHER): Payer: Medicare Other | Admitting: Physician Assistant

## 2017-04-03 VITALS — BP 166/78 | HR 91 | Temp 98.5°F | Ht 60.0 in | Wt 155.0 lb

## 2017-04-03 DIAGNOSIS — H1132 Conjunctival hemorrhage, left eye: Secondary | ICD-10-CM

## 2017-04-03 NOTE — Progress Notes (Signed)
BP (!) 166/78 (BP Location: Left Arm, Patient Position: Sitting, Cuff Size: Normal)   Pulse 91   Temp 98.5 F (36.9 C) (Oral)   Ht 5' (1.524 m)   Wt 155 lb (70.3 kg)   BMI 30.27 kg/m    Subjective:    Patient ID: Kristina Whitaker, female    DOB: [birth year], 66 y.o.   MRN: [id number]  HPI: Kristina Whitaker is a 66 y.o. female presenting on 04/03/2017 for eye irritation (eye redness L eye, this AM)  This morning the patient noticed bright red blood on her lateral left eye. She had tried her hair upside down and had flipped it back up. She has not noticed any pain, fever, chills. She did sneeze pretty hard last night. Thus the only other Valsalva type maneuver that she can think of. She does have a long history of eye issues. She was having difficulty with the glans secreting proper fluids over the past couple years. She is seen by [facility]. She was released just a few months ago. She continued to have one lesion in the left lower lid that had to have an injection. She has not had any injection to her eyeball. She denies any visual acuity changes. And again she is having no pain.  Relevant past medical, surgical, family and social history reviewed and updated as indicated. Allergies and medications reviewed and updated.  Past Medical History:  Diagnosis Date  . Cancer ([place])   . DVT (deep venous thrombosis) ([place]) 2015  . Fibroids    uterine around age 68  . Hypertension   . Melanoma in situ of back ([place])    1991  . Ovarian tumor 2011   LMP    Past Surgical History:  Procedure Laterality Date  . BACK SURGERY     melanoma exicision from back  . BREAST SURGERY     bilateral fibroadenomas/ cyst aspiration  . FACIAL COSMETIC SURGERY     2006  . OVARIAN CYST REMOVAL     x2 with rupture x2 in her 64s and 40s    Review of Systems  Constitutional: Negative.   HENT: Negative.   Eyes: Positive for redness. Negative for photophobia, pain, discharge, itching and visual disturbance.    Respiratory: Negative.   Gastrointestinal: Negative.   Genitourinary: Negative.     Allergies as of 04/03/2017      Reactions   Diethylpropion    Penicillins Hives   Sulfa Drugs Cross Reactors Hives      Medication List       Accurate as of 04/03/17 11:07 AM. Always use your most recent med list.          aspirin 81 MG tablet Take 162 mg by mouth daily.   fish oil-omega-3 fatty acids 1000 MG capsule Take 1,000 mg by mouth daily.   lisinopril 20 MG tablet Commonly known as:  PRINIVIL,ZESTRIL Take 20 mg by mouth daily. Generic   rosuvastatin 10 MG tablet Commonly known as:  CRESTOR Take 10 mg by mouth daily.   tretinoin 0.025 % cream Commonly known as:  RETIN-A APPLY A THIN LAYER TO THE FACE NIGHTLY          Objective:    BP (!) 166/78 (BP Location: Left Arm, Patient Position: Sitting, Cuff Size: Normal)   Pulse 91   Temp 98.5 F (36.9 C) (Oral)   Ht 5' (1.524 m)   Wt 155 lb (70.3 kg)   BMI 30.27 kg/m   Allergies  Allergen Reactions  . Diethylpropion   . Penicillins Hives  . Sulfa Drugs Cross Reactors Hives    Physical Exam  Constitutional: She is oriented to person, place, and time. She appears well-developed and well-nourished.  HENT:  Head: Normocephalic and atraumatic.  Eyes: Pupils are equal, round, and reactive to light. EOM and lids are normal. Right eye exhibits no chemosis, no discharge and no exudate. Left eye exhibits no chemosis, no discharge and no exudate. Left conjunctiva is not injected. Left conjunctiva has a hemorrhage. No scleral icterus. Left eye exhibits normal extraocular motion.    Cardiovascular: Normal rate, regular rhythm, normal heart sounds and intact distal pulses.   Pulmonary/Chest: Effort normal and breath sounds normal.  Abdominal: Soft. Bowel sounds are normal.  Neurological: She is alert and oriented to person, place, and time. She has normal reflexes.  Skin: Skin is warm and dry. No rash noted.  Psychiatric: She  has a normal mood and affect. Her behavior is normal. Judgment and thought content normal.        Assessment & Plan:   1. Subconjunctival hematoma, left Patch Rest Reassure Call back if any changes Call opthalmology next week if any changes   Current Outpatient Prescriptions:  .  aspirin 81 MG tablet, Take 162 mg by mouth daily., Disp: , Rfl:  .  fish oil-omega-3 fatty acids 1000 MG capsule, Take 1,000 mg by mouth daily.  , Disp: , Rfl:  .  lisinopril (PRINIVIL,ZESTRIL) 20 MG tablet, Take 20 mg by mouth daily. Generic, Disp: , Rfl:  .  rosuvastatin (CRESTOR) 10 MG tablet, Take 10 mg by mouth daily.  , Disp: , Rfl:  .  tretinoin (RETIN-A) 0.025 % cream, APPLY A THIN LAYER TO THE FACE NIGHTLY, Disp: , Rfl: 5 Continue all other maintenance medications as listed above.  Follow up plan: Return if symptoms worsen or fail to improve.  Educational handout given for some conjunctival hematoma  Terald Sleeper PA-C [facility] [address]  [place], [place] [address] [phone]   04/03/2017, 11:07 AM

## 2017-04-03 NOTE — Patient Instructions (Signed)

## 2017-04-06 ENCOUNTER — Ambulatory Visit (INDEPENDENT_AMBULATORY_CARE_PROVIDER_SITE_OTHER): Payer: Medicare Other | Admitting: *Deleted

## 2017-04-06 VITALS — BP 125/68 | HR 68

## 2017-04-06 DIAGNOSIS — I10 Essential (primary) hypertension: Secondary | ICD-10-CM

## 2017-04-06 NOTE — Progress Notes (Signed)
Pt here for BP ck BP 125 68 P 68

## 2017-04-09 ENCOUNTER — Encounter: Payer: Self-pay | Admitting: Family

## 2017-04-09 ENCOUNTER — Ambulatory Visit (INDEPENDENT_AMBULATORY_CARE_PROVIDER_SITE_OTHER): Payer: Medicare Other | Admitting: Family

## 2017-04-09 VITALS — BP 135/69 | HR 82 | Temp 99.3°F | Ht 60.0 in | Wt 154.0 lb

## 2017-04-09 DIAGNOSIS — S30860A Insect bite (nonvenomous) of lower back and pelvis, initial encounter: Secondary | ICD-10-CM

## 2017-04-09 DIAGNOSIS — W57XXXA Bitten or stung by nonvenomous insect and other nonvenomous arthropods, initial encounter: Secondary | ICD-10-CM

## 2017-04-09 MED ORDER — TRIAMCINOLONE ACETONIDE 0.5 % EX OINT: 1.0000 "application " | TOPICAL_OINTMENT | Freq: Two times a day (BID) | CUTANEOUS | 0 refills | Status: DC

## 2017-04-09 NOTE — Patient Instructions (Signed)
Insect Bite, Adult An insect bite can make your skin red, itchy, and swollen. An insect bite is different from an insect sting, which happens when an insect injects poison (venom) into the skin. Some insects can spread disease to people through a bite. However, most insect bites do not lead to disease and are not serious. What are the causes? Insects may bite for a variety of reasons, including:  Hunger.  To defend themselves.  Insects that bite include:  Spiders.  Mosquitoes.  Ticks.  Fleas.  Ants.  Flies.  Bedbugs.  What are the signs or symptoms? Symptoms of this condition include:  Itching or pain in the bite area.  Redness and swelling in the bite area.  An open wound (skin ulcer).  In many cases, symptoms last for 2-4 days. How is this diagnosed? This condition is usually diagnosed based on symptoms and a physical exam. How is this treated? Treatment is usually not needed. Symptoms often go away on their own. When treatment is recommended, it may involve:  Applying a cream or lotion to the bitten area. This treatment helps with itching.  Taking an antibiotic medicine. This treatment is needed if the bite area gets infected.  Getting a tetanus shot.  Applying ice to the affected area.  Medicines called antihistamines. This treatment is needed if you develop an allergic reaction to the insect bite.  Follow these instructions at home: Bite area care  Do not scratch the bite area.  Keep the bite area clean and dry. Wash it every day with soap and water as told by your health care provider.  Check the bite area every day for signs of infection. Check for: ? More redness, swelling, or pain. ? Fluid or blood. ? Warmth. ? Pus. Managing pain, itching, and swelling   You may apply a baking soda paste, cortisone cream, or calamine lotion to the bite area as told by your health care provider.  If directed, applyice to the bite area. ? Put ice in a  plastic bag. ? Place a towel between your skin and the bag. ? Leave the ice on for 20 minutes, 2-3 times per day. Medicines  Apply or take over-the-counter and prescription medicines only as told by your health care provider.  If you were prescribed an antibiotic medicine, use it as told by your health care provider. Do not stop using the antibiotic even if your condition improves. General instructions  Keep all follow-up visits as told by your health care provider. This is important. How is this prevented? To help reduce your risk of insect bites:  When you are outdoors, wear clothing that covers your arms and legs.  Use insect repellent. The best insect repellents contain: ? DEET, picaridin, oil of lemon eucalyptus (OLE), or IR3535. ? Higher amounts of an active ingredient.  If your home windows do not have screens, consider installing them.  Contact a health care provider if:  You have more redness, swelling, or pain in the bite area.  You have fluid, blood, or pus coming from the bite area.  The bite area feels warm to the touch.  You have a fever. Get help right away if:  You have joint pain.  You have a rash.  You have shortness of breath.  You feel unusually tired or sleepy.  You have neck pain.  You have a headache.  You have unusual weakness.  You have chest pain.  You have nausea, vomiting, or pain in the abdomen. This   information is not intended to replace advice given to you by your health care provider. Make sure you discuss any questions you have with your health care provider. Document Released: 09/03/2004 Document Revised: 03/25/2016 Document Reviewed: 02/03/2016 Elsevier Interactive Patient Education  2018 Elsevier Inc.  

## 2017-04-09 NOTE — Addendum Note (Signed)
Addended by: Evelina Dun A on: 04/09/2017 03:51 PM   Modules accepted: Orders

## 2017-04-09 NOTE — Progress Notes (Signed)
   Subjective:    Patient ID: Kristina Whitaker, female    DOB: Apr 13, 1951, 66 y.o.   MRN: [id number]  HPI Pt presents to the office today with a tick bite on her back. Pt states she removed a tick April, and then it was itching and on 12/22/16 went to the doctor because she was scared "part of the tick was still in her back". Pt was told the tick was removed.   PT states there is now a red ring around that area that itches. Denies any pain, headache, new joint pain, or fever.    Review of Systems  Skin: Positive for rash.  All other systems reviewed and are negative.      Objective:   Physical Exam  Constitutional: She is oriented to person, place, and time. She appears well-developed and well-nourished. No distress.  HENT:  Head: Normocephalic.  Eyes: Pupils are equal, round, and reactive to light.  Neck: Normal range of motion. Neck supple. No thyromegaly present.  Cardiovascular: Normal rate, regular rhythm, normal heart sounds and intact distal pulses.   No murmur heard. Pulmonary/Chest: Effort normal and breath sounds normal. No respiratory distress. She has no wheezes.  Abdominal: Soft. Bowel sounds are normal. She exhibits no distension. There is no tenderness.  Musculoskeletal: Normal range of motion. She exhibits no edema or tenderness.  Neurological: She is alert and oriented to person, place, and time.  Skin: Skin is warm and dry. Rash noted. Rash is papular.  Erythemas Papule on left bra line approx 0.6X0.4 mm  Psychiatric: She has a normal mood and affect. Her behavior is normal. Judgment and thought content normal.  Vitals reviewed.     BP 135/69   Pulse 82   Temp 99.3 F (37.4 C) (Oral)   Ht 5' (1.524 m)   Wt 154 lb (69.9 kg)   BMI 30.08 kg/m      Assessment & Plan:  1. Insect bite, initial encounter -Pt to report any new fever, joint pain, or rash -Wear protective clothing while outside- Long sleeves and long pants -Put insect repellent on all exposed  skin and along clothing -Take a shower as soon as possible after being outside Do not scratch PT does not want lab work at this time, states she is getting lab work drawn in a few weeks and have it "added" to that RTO prn  - triamcinolone ointment (KENALOG) 0.5 %; Apply 1 application topically 2 (two) times daily.  Dispense: 30 g; Refill: 0    Evelina Dun, FNP

## 2017-08-25 ENCOUNTER — Encounter: Payer: Self-pay | Admitting: Family

## 2017-08-25 ENCOUNTER — Ambulatory Visit: Payer: Medicare Other | Admitting: Family

## 2017-08-25 VITALS — BP 144/76 | HR 89 | Temp 98.6°F | Ht 60.0 in | Wt 159.6 lb

## 2017-08-25 DIAGNOSIS — E041 Nontoxic single thyroid nodule: Secondary | ICD-10-CM | POA: Diagnosis not present

## 2017-08-25 NOTE — Progress Notes (Signed)
Subjective:    Patient ID: Kristina Whitaker, female    DOB: 08/12/1950, 66 y.o.   MRN: [id number]  HPI Pt presents to the office today to discuss thyroid US. Pt states she had the US completed on 07/22/17 and found to have single indeterminate nodule of left lobe of the thyroid. Pt requesting a referral to endocrinologists. Pt states she has normal Thyroid panel.   Pt states she has dry skin and at times fatigue, but denies any weight gain.    Review of Systems  Constitutional: Positive for fatigue.  All other systems reviewed and are negative.      Objective:   Physical Exam  Constitutional: She is oriented to person, place, and time. She appears well-developed and well-nourished. No distress.  HENT:  Head: Normocephalic.  Eyes: Pupils are equal, round, and reactive to light.  Neck: Normal range of motion. Neck supple. No thyromegaly present.  Cardiovascular: Normal rate, regular rhythm, normal heart sounds and intact distal pulses.  No murmur heard. Pulmonary/Chest: Effort normal and breath sounds normal. No respiratory distress. She has no wheezes.  Abdominal: Soft. Bowel sounds are normal. She exhibits no distension. There is no tenderness.  Musculoskeletal: Normal range of motion. She exhibits no edema or tenderness.  Neurological: She is alert and oriented to person, place, and time.  Skin: Skin is warm and dry.  Psychiatric: She has a normal mood and affect. Her behavior is normal. Judgment and thought content normal.  Vitals reviewed.     BP (!) 144/76   Pulse 89   Temp 98.6 F (37 C) (Oral)   Ht 5' (1.524 m)   Wt 159 lb 9.6 oz (72.4 kg)   BMI 31.17 kg/m      Assessment & Plan:  1. Thyroid nodule Will repeat US in 6 months, if increased in size will send for biopsy  Labs pending If any changes, or changes in swallowing, or any swollen lymph nodes return to clinic sooner - Thyroid Panel With TSH - BMP8+EGFR     , FNP  

## 2017-08-25 NOTE — Patient Instructions (Signed)
Thyroid Nodule A thyroid nodule is an isolatedgrowth of thyroid cells that forms a lump in your thyroid gland. The thyroid gland is a butterfly-shaped gland. It is found in the lower front of your neck. This gland sends chemical messengers (hormones) through your blood to all parts of your body. These hormones are important in regulating your body temperature and helping your body to use energy. Thyroid nodules are common. Most are not cancerous (are benign). You may have one nodule or several nodules. Different types of thyroid nodules include:  Nodules that grow and fill with fluid (thyroid cysts).  Nodules that produce too much thyroid hormone (hot nodules or hyperthyroid).  Nodules that produce no thyroid hormone (cold nodules or hypothyroid).  Nodules that form from cancer cells (thyroid cancers).  What are the causes? Usually, the cause of this condition is not known. What increases the risk? Factors that make this condition more likely to develop include:  Increasing age. Thyroid nodules become more common in people who are older than 68 years of age.  Gender. ? Benign thyroid nodules are more common in women. ? Cancerous (malignant) thyroid nodules are more common in men.  A family history that includes: ? Thyroid nodules. ? Pheochromocytoma. ? Thyroid carcinoma. ? Hyperparathyroidism.  Certain kinds of thyroid diseases, such as Hashimoto thyroiditis.  Lack of iodine.  A history of head and neck radiation, such as from X-rays.  What are the signs or symptoms? It is common for this condition to cause no symptoms. If you have symptoms, they may include:  A lump in your lower neck.  Feeling a lump or tickle in your throat.  Pain in your neck, jaw, or ear.  Having trouble swallowing.  Hot nodules may cause symptoms that include:  Weight loss.  Warm, flushed skin.  Feeling hot.  Feeling nervous.  A racing heartbeat.  Cold nodules may cause symptoms that  include:  Weight gain.  Dry skin.  Brittle hair. This may also occur with hair loss.  Feeling cold.  Fatigue.  Thyroid cancer nodules may cause symptoms that include:  Hard nodules that feel stuck to the thyroid gland.  Hoarseness.  Lumps in the glands near your thyroid (lymph nodes).  How is this diagnosed? A thyroid nodule may be felt by your health care provider during a physical exam. This condition may also be diagnosed based on your symptoms. You may also have tests, including:  An ultrasound. This may be done to confirm the diagnosis.  A biopsy. This involves taking a sample from the nodule and looking at it under a microscope to see if the nodule is benign.  Blood tests to make sure that your thyroid is working properly.  Imaging tests such as MRI or CT scan may be done if: ? Your nodule is large. ? Your nodule is blocking your airway. ? Cancer is suspected.  How is this treated? Treatment depends on the cause and size of your nodule or nodules. If the nodule is benign, treatment may not be necessary. Your health care provider may monitor the nodule to see if it goes away without treatment. If the nodule continues to grow, is cancerous, or does not go away:  It may need to be drained with a needle.  It may need to be removed with surgery.  If you have surgery, part or all of your thyroid gland may need to be removed as well. Follow these instructions at home:  Pay attention to any changes in your   nodule.  Take over-the-counter and prescription medicines only as told by your health care provider.  Keep all follow-up visits as told by your health care provider. This is important. Contact a health care provider if:  Your voice changes.  You have trouble swallowing.  You have pain in your neck, ear, or jaw that is getting worse.  Your nodule gets bigger.  Your nodule starts to make it harder for you to breathe. Get help right away if:  You have a  sudden fever.  You feel very weak.  Your muscles look like they are shrinking (muscle wasting).  You have mood swings.  You feel very restless.  You feel confused.  You are seeing or hearing things that other people do not see or hear (having hallucinations).  You feel suddenly nauseous or throw up.  You suddenly have diarrhea.  You have chest pain.  There is a loss of consciousness. This information is not intended to replace advice given to you by your health care provider. Make sure you discuss any questions you have with your health care provider. Document Released: 06/19/2004 Document Revised: 03/29/2016 Document Reviewed: 11/07/2014 Elsevier Interactive Patient Education  2018 Elsevier Inc.  

## 2017-11-18 ENCOUNTER — Encounter: Payer: Self-pay | Admitting: *Deleted

## 2018-01-13 DIAGNOSIS — E041 Nontoxic single thyroid nodule: Secondary | ICD-10-CM | POA: Insufficient documentation

## 2018-02-25 ENCOUNTER — Ambulatory Visit (INDEPENDENT_AMBULATORY_CARE_PROVIDER_SITE_OTHER): Payer: Medicare Other | Admitting: Family

## 2018-02-25 ENCOUNTER — Encounter: Payer: Self-pay | Admitting: Family

## 2018-02-25 VITALS — BP 136/71 | HR 82 | Temp 97.5°F | Ht 60.0 in | Wt 158.6 lb

## 2018-02-25 DIAGNOSIS — J301 Allergic rhinitis due to pollen: Secondary | ICD-10-CM

## 2018-02-25 DIAGNOSIS — H938X2 Other specified disorders of left ear: Secondary | ICD-10-CM | POA: Diagnosis not present

## 2018-02-25 MED ORDER — CETIRIZINE HCL 10 MG PO TABS: 10.0000 mg | ORAL_TABLET | Freq: Every day | ORAL | 11 refills | Status: DC

## 2018-02-25 MED ORDER — FLUTICASONE PROPIONATE 50 MCG/ACT NA SUSP: 2.0000 | Freq: Every day | NASAL | 6 refills | Status: DC

## 2018-02-25 NOTE — Progress Notes (Signed)
   Subjective:    Patient ID: Kristina Whitaker, female    DOB: [birth year], 67 y.o.   MRN: [id number]  Chief Complaint  Patient presents with  . check ears for wax or fluid    Ear Fullness   There is pain in the left ear. This is a new problem. The current episode started 1 to 4 weeks ago. The problem occurs constantly. The problem has been unchanged. There has been no fever. The pain is at a severity of 3/10. The pain is mild. Associated symptoms include hearing loss. Pertinent negatives include no coughing, ear discharge, headaches, rhinorrhea or sore throat. She has tried nothing for the symptoms. The treatment provided no relief.      Review of Systems  HENT: Positive for hearing loss. Negative for ear discharge, rhinorrhea and sore throat.   Respiratory: Negative for cough.   Neurological: Negative for headaches.  All other systems reviewed and are negative.      Objective:   Physical Exam  Constitutional: She is oriented to person, place, and time. She appears well-developed and well-nourished. No distress.  HENT:  Head: Normocephalic and atraumatic.  Right Ear: External ear normal.  Mouth/Throat: Oropharynx is clear and moist.  Eyes: Pupils are equal, round, and reactive to light.  Neck: Normal range of motion. Neck supple. No thyromegaly present.  Cardiovascular: Normal rate, regular rhythm, normal heart sounds and intact distal pulses.  No murmur heard. Pulmonary/Chest: Effort normal and breath sounds normal. No respiratory distress. She has no wheezes.  Abdominal: Soft. Bowel sounds are normal. She exhibits no distension. There is no tenderness.  Musculoskeletal: Normal range of motion. She exhibits no edema or tenderness.  Neurological: She is alert and oriented to person, place, and time. She has normal reflexes. No cranial nerve deficit.  Skin: Skin is warm and dry.  Psychiatric: She has a normal mood and affect. Her behavior is normal. Judgment and thought content  normal.  Vitals reviewed.     BP 136/71   Pulse 82   Temp (!) 97.5 F (36.4 C) (Oral)   Ht 5' (1.524 m)   Wt 158 lb 9.6 oz (71.9 kg)   BMI 30.97 kg/m      Assessment & Plan:  [place] was seen today for check ears for wax or fluid.  Diagnoses and all orders for this visit:  Sensation of fullness in left ear -     Discontinue: fluticasone (FLONASE) 50 MCG/ACT nasal spray; Place 2 sprays into both nostrils daily. -     Discontinue: cetirizine (ZYRTEC) 10 MG tablet; Take 1 tablet (10 mg total) by mouth daily. -     fluticasone (FLONASE) 50 MCG/ACT nasal spray; Place 2 sprays into both nostrils daily. -     cetirizine (ZYRTEC) 10 MG tablet; Take 1 tablet (10 mg total) by mouth daily.  Allergic rhinitis due to pollen, unspecified seasonality -     Discontinue: fluticasone (FLONASE) 50 MCG/ACT nasal spray; Place 2 sprays into both nostrils daily. -     Discontinue: cetirizine (ZYRTEC) 10 MG tablet; Take 1 tablet (10 mg total) by mouth daily. -     fluticasone (FLONASE) 50 MCG/ACT nasal spray; Place 2 sprays into both nostrils daily. -     cetirizine (ZYRTEC) 10 MG tablet; Take 1 tablet (10 mg total) by mouth daily.   Start zyrtec and flonase Tylenol as needed Force fluids RTO as needed   Evelina Dun, FNP

## 2018-02-25 NOTE — Patient Instructions (Signed)

## 2018-03-10 ENCOUNTER — Encounter: Payer: Self-pay | Admitting: Family

## 2018-03-10 ENCOUNTER — Ambulatory Visit: Payer: Medicare Other | Admitting: Family

## 2018-03-10 VITALS — BP 159/82 | HR 64 | Temp 97.9°F | Ht 60.0 in | Wt 157.4 lb

## 2018-03-10 DIAGNOSIS — S058X2A Other injuries of left eye and orbit, initial encounter: Secondary | ICD-10-CM

## 2018-03-10 DIAGNOSIS — I10 Essential (primary) hypertension: Secondary | ICD-10-CM | POA: Diagnosis not present

## 2018-03-10 MED ORDER — ERYTHROMYCIN 5 MG/GM OP OINT: 1.0000 "application " | TOPICAL_OINTMENT | Freq: Every day | OPHTHALMIC | 0 refills | Status: DC

## 2018-03-10 NOTE — Patient Instructions (Signed)
Corneal Abrasion  A corneal abrasion is a scratch or injury to the clear covering over the front of your eye (cornea). Your cornea forms a clear dome that protects your eye and helps to focus your vision. Your cornea is made up of many layers. The surface layer is a single layer of cells (corneal epithelium). It is one of the most sensitive tissues in your body. A corneal abrasion can be very painful.  If a corneal abrasion is not treated, it can become infected and cause an ulcer. This can lead to scarring. A scarred cornea can affect your vision. Sometimes abrasions come back in the same area, even after the original injury has healed (recurrent erosion syndrome).  What are the causes?  This condition may be caused by:   A poke in the eye.   A gritty or irritating substance (foreign body) in the eye.   Excessive eye rubbing.   Very dry eyes.   Certain eye infections.   Contact lenses that fit poorly or are worn for a long period of time. You can also injure your cornea when putting contacts lenses in your eye or taking them out.   Eye surgery.    Sometimes, the cause is unknown.  What are the signs or symptoms?  Symptoms of this condition include:   Eye pain. The pain may get worse when your eye is open or when you move your eye.   A feeling of something stuck in your eye.   Having trouble keeping your eye open, or not being able to keep it open.   Tearing and redness.   Sensitivity to light.   Blurred vision.   Headache.    How is this diagnosed?  This condition may be diagnosed based on:   Your medical history.   Your symptoms.   An eye exam. You may work with a health care provider who specializes in diseases and conditions of the eye (ophthalmologist). Before the eye exam, numbing drops may be put into your eye. You may also have dye put in your eye with a dropper or a small paper strip. The dye makes the abrasion easy to see when your ophthalmologist examines your eye with a light. Your  ophthalmologist may look at your eye through an eye scope (slit lamp).    How is this treated?  Treatment may vary depending on the cause of your condition, and it may include:   Washing out your eye.   Removing any foreign body.   Antibiotic drops or ointment to treat an infection.   Steroid drops or ointment to treat redness, irritation, or inflammation.   Pain medicine.   An eye patch to keep your eye closed.    Follow these instructions at home:  Medicines   Use eye drops or ointments as told by your eye care provider.   If you were prescribed antibiotic drops or ointment, use them as told by your eye care provider. Do not stop using the antibiotic even if you start to feel better.   Take over-the-counter and prescription medicines only as told by your eye care provider.   Do not drive or use heavy machinery while taking prescription pain medicine.  General instructions   If you have an eye patch, wear it as told by your eye care provider.  ? Do not drive or use machinery while wearing an eye patch. Your ability to judge distances will be impaired.  ? Follow instructions from your eye care provider   about when to remove the patch.   Ask your eye care provider whether you can use a cold, wet cloth (compress) on your eye to relieve pain.   Do not rub or touch your eye. Do not wash out your eye.   Do not wear contact lenses until your eye care provider says that this is okay.   Avoid bright light and eye strain.   Keep all follow-up visits as told by your eye care provider. This is important for preventing infection and vision loss.  Contact a health care provider if:   You continue to have eye pain and other symptoms for more than 2 days.   You develop new symptoms, such as redness, tearing, or discharge.   You have discharge that makes your eyelids stick together in the morning.   Your eye patch becomes so loose that you can blink your eye.   Symptoms return after the original abrasion has  healed.  Get help right away if:   You have severe eye pain that does not get better with medicine.   You have vision loss.  Summary   A corneal abrasion is a scratch on the outer layer of the clear covering over the front of your eye (cornea).   Corneal abrasion can cause eye pain, redness, tearing, and blurred vision.   This condition is usually treated with medicine to prevent infection and scarring. You also may have to wear an eye patch to cover your eye.   Let your eye care provider know if your symptoms continue for more than 2 days.  This information is not intended to replace advice given to you by your health care provider. Make sure you discuss any questions you have with your health care provider.  Document Released: 07/24/2000 Document Revised: 07/07/2016 Document Reviewed: 07/07/2016  Elsevier Interactive Patient Education  2018 [employer]

## 2018-03-10 NOTE — Progress Notes (Signed)
Subjective:    Patient ID: Kristina Whitaker, female    DOB: September 04, 1950, 67 y.o.   MRN: [id number]  Chief Complaint  Patient presents with  . eye irritation   Pt presents to the office today to have her eye checked. States she was putting on her mascara two days ago and hit her eye. Since her eye has been watering. She states three years ago she had an abrasion to her left eye and had to follow up with Opth for several months.   PT's BP is elevated today. States she took her BP at home last night and it was 130's/70's.  Eye Injury   The left eye is affected. The current episode started in the past 7 days. Injury mechanism: was putting on mascara. The pain is at a severity of 0/10. The patient is experiencing no pain. There is no known exposure to pink eye. She does not wear contacts. Associated symptoms include blurred vision (when it is "watering"). Pertinent negatives include no eye redness, fever, foreign body sensation or itching. Eye discharge: "it just waters" She has tried eye drops for the symptoms. The treatment provided mild relief.      Review of Systems  Constitutional: Negative for fever.  Eyes: Positive for blurred vision (when it is "watering"). Negative for redness and itching. Eye discharge: "it just waters"  All other systems reviewed and are negative.      Objective:   Physical Exam  Constitutional: She is oriented to person, place, and time. She appears well-developed and well-nourished. No distress.  HENT:  Head: Normocephalic and atraumatic.  Right Ear: External ear normal.  Mouth/Throat: Oropharynx is clear and moist.  Eyes: Pupils are equal, round, and reactive to light. Right eye exhibits no chemosis, no discharge, no exudate and no hordeolum. No foreign body present in the right eye. Left eye exhibits no chemosis, no discharge and no exudate. No foreign body present in the left eye.  Neck: Normal range of motion. Neck supple. No thyromegaly present.    Cardiovascular: Normal rate, regular rhythm, normal heart sounds and intact distal pulses.  No murmur heard. Pulmonary/Chest: Effort normal and breath sounds normal. No respiratory distress. She has no wheezes.  Abdominal: Soft. Bowel sounds are normal. She exhibits no distension. There is no tenderness.  Musculoskeletal: Normal range of motion. She exhibits no edema or tenderness.  Neurological: She is alert and oriented to person, place, and time. She has normal reflexes. No cranial nerve deficit.  Skin: Skin is warm and dry.  Psychiatric: She has a normal mood and affect. Her behavior is normal. Judgment and thought content normal.  Vitals reviewed.     BP (!) 164/66   Pulse 64   Temp 97.9 F (36.6 C) (Oral)   Ht 5' (1.524 m)   Wt 157 lb 6.4 oz (71.4 kg)   BMI 30.74 kg/m      Assessment & Plan:  Kristina Whitaker comes in today with chief complaint of eye irritation   Diagnosis and orders addressed:  1. Corneal injury of left eye, initial encounter Will prescribe antibiotic eye drops as precaution since patient has hx of abrasion Do not rub eye Good hand hygiene  Avoid reinjury  RTO if symptoms worsen or do not improve, keep follow up with Opth - erythromycin ophthalmic ointment; Place 1 application into the left eye at bedtime.  Dispense: 3.5 g; Refill: 0  2. Essential hypertension BP normal range at home    [facility],  FNP  

## 2018-03-11 ENCOUNTER — Telehealth: Payer: Self-pay | Admitting: Family

## 2018-03-11 NOTE — Telephone Encounter (Signed)
Patient aware that eye ointment was sent to [facility] pharmacy at 8:48am 03/10/18

## 2018-04-27 ENCOUNTER — Other Ambulatory Visit: Payer: Self-pay | Admitting: Family

## 2018-04-27 DIAGNOSIS — Z1231 Encounter for screening mammogram for malignant neoplasm of breast: Secondary | ICD-10-CM

## 2018-05-02 ENCOUNTER — Ambulatory Visit (HOSPITAL_COMMUNITY): Payer: 59

## 2018-05-11 ENCOUNTER — Ambulatory Visit (HOSPITAL_COMMUNITY)
Admission: RE | Admit: 2018-05-11 | Discharge: 2018-05-11 | Disposition: A | Discharge status: Home / Self Care | Payer: Medicare Other | Source: Ambulatory Visit | Attending: Family | Admitting: Family

## 2018-05-11 DIAGNOSIS — Z1231 Encounter for screening mammogram for malignant neoplasm of breast: Secondary | ICD-10-CM | POA: Diagnosis present

## 2019-01-16 ENCOUNTER — Other Ambulatory Visit: Payer: Self-pay

## 2019-01-17 ENCOUNTER — Ambulatory Visit (INDEPENDENT_AMBULATORY_CARE_PROVIDER_SITE_OTHER): Payer: Medicare Other | Admitting: Family

## 2019-01-17 ENCOUNTER — Encounter: Payer: Self-pay | Admitting: Family

## 2019-01-17 ENCOUNTER — Telehealth: Payer: Self-pay | Admitting: Family

## 2019-01-17 VITALS — BP 180/75 | HR 63 | Temp 97.9°F | Ht 60.0 in | Wt 155.0 lb

## 2019-01-17 DIAGNOSIS — E785 Hyperlipidemia, unspecified: Secondary | ICD-10-CM | POA: Diagnosis not present

## 2019-01-17 DIAGNOSIS — Z0001 Encounter for general adult medical examination with abnormal findings: Secondary | ICD-10-CM

## 2019-01-17 DIAGNOSIS — Z0000 Encounter for general adult medical examination without abnormal findings: Secondary | ICD-10-CM

## 2019-01-17 DIAGNOSIS — I10 Essential (primary) hypertension: Secondary | ICD-10-CM

## 2019-01-17 MED ORDER — AMLODIPINE BESYLATE 5 MG PO TABS: 5.0000 mg | ORAL_TABLET | Freq: Every day | ORAL | 1 refills | Status: DC

## 2019-01-17 MED ORDER — LOSARTAN POTASSIUM 100 MG PO TABS: 100.0000 mg | ORAL_TABLET | Freq: Every day | ORAL | 3 refills | Status: DC

## 2019-01-17 MED ORDER — ROSUVASTATIN CALCIUM 10 MG PO TABS: 10.0000 mg | ORAL_TABLET | Freq: Every day | ORAL | 2 refills | Status: DC

## 2019-01-17 MED ORDER — AMLODIPINE BESYLATE 5 MG PO TABS: 5.0000 mg | ORAL_TABLET | Freq: Every day | ORAL | 3 refills | Status: DC

## 2019-01-17 NOTE — Telephone Encounter (Signed)
Please review and advise.

## 2019-01-17 NOTE — Progress Notes (Signed)
Subjective:    Patient ID: Kristina Whitaker, female    DOB: [birth year], 68 y.o.   MRN: [id number]  Chief Complaint  Patient presents with  . Annual Exam   PT presents to the office today for CPE. Pt is very tearful today. Stating the last few weeks have been have been hard since she can not see her mother who is in a nursing home and she found her nephew dead.  Hypertension  This is a chronic problem. The current episode started more than 1 year ago. The problem is unchanged. The problem is uncontrolled. Associated symptoms include malaise/fatigue. Pertinent negatives include no peripheral edema or shortness of breath. Risk factors for coronary artery disease include dyslipidemia and obesity. Past treatments include angiotensin blockers. The current treatment provides no improvement. There is no history of kidney disease, CAD/MI, CVA or heart failure.  Hyperlipidemia  This is a chronic problem. The current episode started more than 1 year ago. The problem is controlled. Recent lipid tests were reviewed and are normal. Pertinent negatives include no shortness of breath. Current antihyperlipidemic treatment includes statins and herbal therapy. The current treatment provides moderate improvement of lipids. Risk factors for coronary artery disease include dyslipidemia, hypertension, a sedentary lifestyle and post-menopausal.      Review of Systems  Constitutional: Positive for malaise/fatigue.  Respiratory: Negative for shortness of breath.   All other systems reviewed and are negative.  Family History  Problem Relation Age of Onset  . Heart attack Sister   . Stroke Sister   . Heart attack Maternal Aunt   . Heart attack Paternal Uncle   . Stroke Father   . Heart attack Brother     Social History   Socioeconomic History  . Marital status: Married    Spouse name: Not on file  . Number of children: Not on file  . Years of education: Not on file  . Highest education level: Not on file   Occupational History  . Not on file  Social Needs  . Financial resource strain: Not on file  . Food insecurity:    Worry: Not on file    Inability: Not on file  . Transportation needs:    Medical: Not on file    Non-medical: Not on file  Tobacco Use  . Smoking status: Never Smoker  . Smokeless tobacco: Never Used  Substance and Sexual Activity  . Alcohol use: Yes    Alcohol/week: 1.0 standard drinks    Types: 1 Glasses of wine per week  . Drug use: No  . Sexual activity: Never  Lifestyle  . Physical activity:    Days per week: Not on file    Minutes per session: Not on file  . Stress: Not on file  Relationships  . Social connections:    Talks on phone: Not on file    Gets together: Not on file    Attends religious service: Not on file    Active member of club or organization: Not on file    Attends meetings of clubs or organizations: Not on file    Relationship status: Not on file  Other Topics Concern  . Not on file  Social History Narrative  . Not on file       Objective:   Physical Exam Vitals signs reviewed.  Constitutional:      General: She is not in acute distress.    Appearance: She is well-developed.  HENT:     Head: Normocephalic and atraumatic.  Right Ear: Tympanic membrane normal.     Left Ear: Tympanic membrane normal.  Eyes:     Pupils: Pupils are equal, round, and reactive to light.  Neck:     Musculoskeletal: Normal range of motion and neck supple.     Thyroid: No thyromegaly.  Cardiovascular:     Rate and Rhythm: Normal rate and regular rhythm.     Heart sounds: Normal heart sounds. No murmur.  Pulmonary:     Effort: Pulmonary effort is normal. No respiratory distress.     Breath sounds: Normal breath sounds. No wheezing.  Abdominal:     General: Bowel sounds are normal. There is no distension.     Palpations: Abdomen is soft.     Tenderness: There is no abdominal tenderness.  Musculoskeletal: Normal range of motion.         General: No tenderness.  Skin:    General: Skin is warm and dry.  Neurological:     Mental Status: She is alert and oriented to person, place, and time.     Cranial Nerves: No cranial nerve deficit.     Deep Tendon Reflexes: Reflexes are normal and symmetric.  Psychiatric:        Mood and Affect: Affect is tearful.        Behavior: Behavior normal.        Thought Content: Thought content normal.        Judgment: Judgment normal.       BP (!) 180/75   Pulse 63   Temp 97.9 F (36.6 C) (Oral)   Ht 5' (1.524 m)   Wt 155 lb (70.3 kg)   BMI 30.27 kg/m      Assessment & Plan:  Kristina Whitaker comes in today with chief complaint of Annual Exam   Diagnosis and orders addressed:  1. Annual physical exam - CMP14+EGFR; Future - CBC with Differential/Platelet; Future - Lipid panel; Future - TSH; Future  2. Essential hypertension BP decreased as patient calmed down, She states she wants to go home and lay down and then take her BP. She will call with results. If still elevated we will add medication.  - losartan (COZAAR) 100 MG tablet; Take 1 tablet (100 mg total) by mouth daily.  Dispense: 90 tablet; Refill: 3 - CMP14+EGFR; Future - CBC with Differential/Platelet; Future  3. Hyperlipidemia, unspecified hyperlipidemia type - rosuvastatin (CRESTOR) 10 MG tablet; Take 1 tablet (10 mg total) by mouth daily.  Dispense: 90 tablet; Refill: 2 - CMP14+EGFR; Future - CBC with Differential/Platelet; Future - Lipid panel; Future   Labs pending- Pt states she does not feel like she could give blood today and can come another day. She states she hates needles and will not do it today. Discussed the importance of monitoring kidney function with HTN and BP medications. She is aware.  Health Maintenance reviewed Diet and exercise encouraged  Follow up plan: 6 months    Evelina Dun, FNP

## 2019-01-17 NOTE — Addendum Note (Signed)
Addended by: Rolena Infante on: 01/17/2019 02:19 PM   Modules accepted: Orders

## 2019-01-17 NOTE — Telephone Encounter (Signed)
Norvasc 5 mg Prescription sent to pharmacy. Low salt diet and stress management. Will need follow up visit in 2 weeks. Could be telephone.

## 2019-01-17 NOTE — Patient Instructions (Signed)

## 2019-01-17 NOTE — Telephone Encounter (Signed)
Patient notified and telephone visit scheduled for 2 weeks

## 2019-01-18 ENCOUNTER — Telehealth: Payer: Self-pay | Admitting: Family

## 2019-01-18 NOTE — Telephone Encounter (Signed)
FYI for provider when returning to work.

## 2019-01-19 NOTE — Telephone Encounter (Signed)
Continue losartan 100 mg daily and Norvasc 5 mg. Low salt diet, stress management.

## 2019-01-20 ENCOUNTER — Encounter: Payer: Medicare Other | Admitting: Family

## 2019-01-31 ENCOUNTER — Ambulatory Visit (INDEPENDENT_AMBULATORY_CARE_PROVIDER_SITE_OTHER): Payer: Medicare Other | Admitting: Family

## 2019-01-31 ENCOUNTER — Encounter: Payer: Self-pay | Admitting: Family

## 2019-01-31 ENCOUNTER — Other Ambulatory Visit: Payer: Self-pay

## 2019-01-31 DIAGNOSIS — B977 Papillomavirus as the cause of diseases classified elsewhere: Secondary | ICD-10-CM

## 2019-01-31 DIAGNOSIS — I10 Essential (primary) hypertension: Secondary | ICD-10-CM

## 2019-01-31 NOTE — Progress Notes (Signed)
   Virtual Visit via telephone Note  I connected with Kristina Whitaker on 01/31/19 at 2:53 pm by telephone and verified that I am speaking with the correct person using two identifiers. Kristina Whitaker is currently located in her car and no one is currently with her during visit. The provider, Evelina Dun, FNP is located in their office at time of visit.  I discussed the limitations, risks, security and privacy concerns of performing an evaluation and management service by telephone and the availability of in person appointments. I also discussed with the patient that there may be a patient responsible charge related to this service. The patient expressed understanding and agreed to proceed.   History and Present Illness:  PT calls the office today for follow up on HTN. She was seen on 01/17/19 and we added Norvasc 5 mg and told to continue Losartan 100 mg. Her BP this morning was 127/87.  She is also requesting referral to GYN. She states she has a hx of HPV and would just like to see someone annually.  Hypertension This is a chronic problem. The current episode started more than 1 year ago. The problem has been resolved since onset. The problem is controlled (127/87). Associated symptoms include malaise/fatigue and peripheral edema (Started wearing her comopression hose and this resolved). Pertinent negatives include no headaches or shortness of breath. Risk factors for coronary artery disease include obesity. The current treatment provides moderate improvement. There is no history of kidney disease, CAD/MI, CVA or heart failure.      Review of Systems  Constitutional: Positive for malaise/fatigue.  Respiratory: Negative for shortness of breath.   Neurological: Negative for headaches.  All other systems reviewed and are negative.    Observations/Objective: No SOB or distress  Assessment and Plan: 1. Essential hypertension Continue medications -Dash diet information given -Exercise  encouraged - Stress Management  -Continue current meds -RTO in 6 months   2. HPV (human papilloma virus) infection - Ambulatory referral to Obstetrics / Gynecology     I discussed the assessment and treatment plan with the patient. The patient was provided an opportunity to ask questions and all were answered. The patient agreed with the plan and demonstrated an understanding of the instructions.   The patient was advised to call back or seek an in-person evaluation if the symptoms worsen or if the condition fails to improve as anticipated.  The above assessment and management plan was discussed with the patient. The patient verbalized understanding of and has agreed to the management plan. Patient is aware to call the clinic if symptoms persist or worsen. Patient is aware when to return to the clinic for a follow-up visit. Patient educated on when it is appropriate to go to the emergency department.   Time call ended: 3:05 PM   I provided 12 minutes of non-face-to-face time during this encounter.    Evelina Dun, FNP

## 2019-02-06 ENCOUNTER — Telehealth: Payer: Self-pay | Admitting: Family

## 2019-02-06 NOTE — Telephone Encounter (Signed)
Aware.  She can call to schedule , no referral necessary.

## 2019-03-16 ENCOUNTER — Other Ambulatory Visit: Payer: Self-pay | Admitting: Family

## 2019-03-21 ENCOUNTER — Other Ambulatory Visit: Payer: Self-pay | Admitting: Family

## 2019-03-21 ENCOUNTER — Ambulatory Visit (HOSPITAL_COMMUNITY)
Admission: RE | Admit: 2019-03-21 | Discharge: 2019-03-21 | Disposition: A | Discharge status: Home / Self Care | Payer: Medicare Other | Source: Ambulatory Visit | Attending: Family | Admitting: Family

## 2019-03-21 ENCOUNTER — Other Ambulatory Visit: Payer: Self-pay

## 2019-03-21 DIAGNOSIS — R600 Localized edema: Secondary | ICD-10-CM

## 2019-05-11 ENCOUNTER — Other Ambulatory Visit (HOSPITAL_COMMUNITY): Payer: Self-pay | Admitting: Family

## 2019-05-11 DIAGNOSIS — Z1231 Encounter for screening mammogram for malignant neoplasm of breast: Secondary | ICD-10-CM

## 2019-05-19 ENCOUNTER — Ambulatory Visit (HOSPITAL_COMMUNITY): Payer: Medicare Other

## 2019-06-19 ENCOUNTER — Other Ambulatory Visit: Payer: Self-pay

## 2019-06-19 ENCOUNTER — Ambulatory Visit (HOSPITAL_COMMUNITY)
Admission: RE | Admit: 2019-06-19 | Discharge: 2019-06-19 | Disposition: A | Discharge status: Home / Self Care | Payer: Medicare Other | Source: Ambulatory Visit | Attending: Family | Admitting: Family

## 2019-06-19 DIAGNOSIS — Z1231 Encounter for screening mammogram for malignant neoplasm of breast: Secondary | ICD-10-CM | POA: Diagnosis present

## 2019-07-19 DIAGNOSIS — H2513 Age-related nuclear cataract, bilateral: Secondary | ICD-10-CM | POA: Diagnosis not present

## 2019-07-19 DIAGNOSIS — H40003 Preglaucoma, unspecified, bilateral: Secondary | ICD-10-CM | POA: Diagnosis not present

## 2019-07-19 DIAGNOSIS — H43811 Vitreous degeneration, right eye: Secondary | ICD-10-CM | POA: Diagnosis not present

## 2019-07-19 DIAGNOSIS — H35379 Puckering of macula, unspecified eye: Secondary | ICD-10-CM | POA: Diagnosis not present

## 2019-08-14 DIAGNOSIS — D485 Neoplasm of uncertain behavior of skin: Secondary | ICD-10-CM | POA: Diagnosis not present

## 2019-08-14 DIAGNOSIS — L821 Other seborrheic keratosis: Secondary | ICD-10-CM | POA: Diagnosis not present

## 2019-08-14 DIAGNOSIS — D2272 Melanocytic nevi of left lower limb, including hip: Secondary | ICD-10-CM | POA: Diagnosis not present

## 2019-08-14 DIAGNOSIS — L820 Inflamed seborrheic keratosis: Secondary | ICD-10-CM | POA: Diagnosis not present

## 2019-09-14 DIAGNOSIS — D485 Neoplasm of uncertain behavior of skin: Secondary | ICD-10-CM | POA: Diagnosis not present

## 2019-09-14 DIAGNOSIS — L905 Scar conditions and fibrosis of skin: Secondary | ICD-10-CM | POA: Diagnosis not present

## 2019-10-03 ENCOUNTER — Encounter: Payer: Self-pay | Admitting: Family

## 2019-10-03 DIAGNOSIS — D485 Neoplasm of uncertain behavior of skin: Secondary | ICD-10-CM | POA: Diagnosis not present

## 2019-10-03 DIAGNOSIS — L820 Inflamed seborrheic keratosis: Secondary | ICD-10-CM | POA: Diagnosis not present

## 2019-10-03 DIAGNOSIS — L905 Scar conditions and fibrosis of skin: Secondary | ICD-10-CM | POA: Diagnosis not present

## 2019-10-03 DIAGNOSIS — D225 Melanocytic nevi of trunk: Secondary | ICD-10-CM | POA: Diagnosis not present

## 2019-10-03 DIAGNOSIS — Z85820 Personal history of malignant melanoma of skin: Secondary | ICD-10-CM | POA: Diagnosis not present

## 2019-10-18 ENCOUNTER — Encounter: Payer: Self-pay | Admitting: Family

## 2019-10-18 ENCOUNTER — Other Ambulatory Visit: Payer: Self-pay

## 2019-10-18 ENCOUNTER — Ambulatory Visit (INDEPENDENT_AMBULATORY_CARE_PROVIDER_SITE_OTHER): Payer: Medicare Other | Admitting: Family

## 2019-10-18 VITALS — BP 178/80 | HR 81 | Temp 98.8°F | Ht 60.0 in | Wt 157.0 lb

## 2019-10-18 DIAGNOSIS — F411 Generalized anxiety disorder: Secondary | ICD-10-CM

## 2019-10-18 DIAGNOSIS — I10 Essential (primary) hypertension: Secondary | ICD-10-CM | POA: Diagnosis not present

## 2019-10-18 DIAGNOSIS — E785 Hyperlipidemia, unspecified: Secondary | ICD-10-CM

## 2019-10-18 MED ORDER — AMLODIPINE BESYLATE 5 MG PO TABS: 5.0000 mg | ORAL_TABLET | Freq: Every day | ORAL | 3 refills | Status: DC

## 2019-10-18 MED ORDER — AMLODIPINE BESYLATE 10 MG PO TABS: 10.0000 mg | ORAL_TABLET | Freq: Every day | ORAL | 3 refills | Status: DC

## 2019-10-18 MED ORDER — LOSARTAN POTASSIUM 100 MG PO TABS: 100.0000 mg | ORAL_TABLET | Freq: Every day | ORAL | 3 refills | Status: DC

## 2019-10-18 NOTE — Progress Notes (Signed)
Subjective:    Patient ID: Kristina Whitaker, female    DOB: Sep 24, 1950, 69 y.o.   MRN: [id number]  Chief Complaint  Patient presents with  . Medical Management of Chronic Issues   PT presents to the office today CPE. She is very stressed and anxious today. 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   Her mother passed away in 08/25/2023.  Hypertension This is a chronic problem. The current episode started more than 1 year ago. The problem has been waxing and waning since onset. The problem is uncontrolled. Associated symptoms include anxiety and malaise/fatigue. Pertinent negatives include no peripheral edema or shortness of breath. Risk factors for coronary artery disease include dyslipidemia, obesity and sedentary lifestyle. The current treatment provides moderate improvement. There is no history of kidney disease or CAD/MI.  Hyperlipidemia This is a chronic problem. The current episode started more than 1 year ago. The problem is controlled. Recent lipid tests were reviewed and are normal. Pertinent negatives include no shortness of breath. Current antihyperlipidemic treatment includes statins. The current treatment provides moderate improvement of lipids. Risk factors for coronary artery disease include dyslipidemia, hypertension, a sedentary lifestyle and post-menopausal.  Anxiety Presents for follow-up visit. Symptoms include depressed mood, excessive worry, irritability, nervous/anxious behavior, panic and restlessness. Patient reports no shortness of breath. Symptoms occur most days. The severity of symptoms is moderate. The quality of sleep is good.        Review of Systems  Constitutional: Positive for irritability and malaise/fatigue.  Respiratory: Negative for shortness of breath.   Psychiatric/Behavioral: The patient is nervous/anxious.   All other systems reviewed and are negative.      Objective:   Physical Exam Vitals reviewed.  Constitutional:    General: She is not in acute distress.    Appearance: She is well-developed.  HENT:     Head: Normocephalic and atraumatic.     Right Ear: Tympanic membrane normal.     Left Ear: Tympanic membrane normal.  Eyes:     Pupils: Pupils are equal, round, and reactive to light.  Neck:     Thyroid: No thyromegaly.  Cardiovascular:     Rate and Rhythm: Normal rate and regular rhythm.     Heart sounds: Normal heart sounds. No murmur.  Pulmonary:     Effort: Pulmonary effort is normal. No respiratory distress.     Breath sounds: Normal breath sounds. No wheezing.  Abdominal:     General: Bowel sounds are normal. There is no distension.     Palpations: Abdomen is soft.     Tenderness: There is no abdominal tenderness.  Musculoskeletal:        General: No tenderness. Normal range of motion.     Cervical back: Normal range of motion and neck supple.  Skin:    General: Skin is warm and dry.  Neurological:     Mental Status: She is alert and oriented to person, place, and time.     Cranial Nerves: No cranial nerve deficit.     Deep Tendon Reflexes: Reflexes are normal and symmetric.  Psychiatric:        Behavior: Behavior normal.        Thought Content: Thought content normal.        Judgment: Judgment normal.       BP (!) 178/80   Pulse 81   Temp 98.8 F (37.1 C) (Temporal)   Ht 5' (1.524 m)   Wt 157 lb (71.2 kg)  SpO2 100%   BMI 30.66 kg/m      Assessment & Plan:  Kristina Whitaker comes in today with chief complaint of Medical Management of Chronic Issues   Diagnosis and orders addressed:  1. Essential hypertension - losartan (COZAAR) 100 MG tablet; Take 1 tablet (100 mg total) by mouth daily.  Dispense: 90 tablet; Refill: 3 - BMP8+EGFR - amLODipine (NORVASC) 5 MG tablet; Take 1 tablet (5 mg total) by mouth daily.  Dispense: 90 tablet; Refill: 3  2. Hyperlipidemia, unspecified hyperlipidemia type - BMP8+EGFR  3. GAD (generalized anxiety disorder) - BMP8+EGFR  After  changing the Norvasc to 10 mg from 5 mg, patient states she has not been taking that medication. We will restart Norvasc 5 mg and recheck in 2 weeks. Letter written for patient to give to her lawyer to help with tenet RTO in 2 weeks to recheck HTN   Evelina Dun, FNP

## 2019-10-18 NOTE — Patient Instructions (Signed)

## 2019-11-03 ENCOUNTER — Ambulatory Visit (INDEPENDENT_AMBULATORY_CARE_PROVIDER_SITE_OTHER): Payer: Medicare Other | Admitting: *Deleted

## 2019-11-03 DIAGNOSIS — Z0000 Encounter for general adult medical examination without abnormal findings: Secondary | ICD-10-CM

## 2019-11-03 NOTE — Progress Notes (Addendum)
MEDICARE ANNUAL WELLNESS VISIT  11/03/2019  Telephone Visit Disclaimer This Medicare AWV was conducted by telephone due to national recommendations for restrictions regarding the COVID-19 Pandemic (e.g. social distancing).  I verified, using two identifiers, that I am speaking with Kristina Whitaker or their authorized healthcare agent. I discussed the limitations, risks, security, and privacy concerns of performing an evaluation and management service by telephone and the potential availability of an in-person appointment in the future. The patient expressed understanding and agreed to proceed.   Subjective:  Kristina Whitaker is a 69 y.o. female patient of Hawks, Theador Hawthorne, FNP who had a Medicare Annual Wellness Visit today via telephone. 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 She lives with her husband Kristina Whitaker. They have one grown daughter that lives locally and they see regularly. She reports that she is socially active and does interact with friends/family regularly. She is not physically active and enjoys traveling.    Patient Care Team: Sharion Balloon, FNP as PCP - General (Family Medicine)  Advanced Directives 11/03/2019  Does Patient Have a Medical Advance Directive? Yes  Type of Paramedic of [place];Living will;Out of facility DNR (pink MOST or yellow form)    Hospital Utilization Over the Past 12 Months: # of hospitalizations or ER visits: 0 # of surgeries: 1  Review of Systems    Patient reports that her overall health is unchanged compared to last year.  History obtained from the patient.  Patient Reported Readings (BP, Pulse, CBG, Weight, etc) none  Pain Assessment Pain : No/denies pain     Current Medications & Allergies (verified) Allergies as of 11/03/2019       Reactions   Diethylpropion    Penicillins Hives   Sulfa Drugs Cross Reactors Hives        Medication List        Accurate as of November 03, 2019  9:56 AM. If you have any questions, ask  your nurse or doctor.          STOP taking these medications    cetirizine 10 MG tablet Commonly known as: ZYRTEC   fish oil-omega-3 fatty acids 1000 MG capsule   fluticasone 50 MCG/ACT nasal spray Commonly known as: FLONASE   ibuprofen 800 MG tablet Commonly known as: ADVIL   Krill Oil 500 MG Caps       TAKE these medications    amLODipine 5 MG tablet Commonly known as: NORVASC Take 1 tablet (5 mg total) by mouth daily. What changed: Another medication with the same name was removed. Continue taking this medication, and follow the directions you see here.   aspirin 81 MG tablet Take 162 mg by mouth daily.   losartan 100 MG tablet Commonly known as: COZAAR Take 1 tablet (100 mg total) by mouth daily.   rosuvastatin 10 MG tablet Commonly known as: CRESTOR Take 1 tablet (10 mg total) by mouth daily.   tretinoin 0.025 % cream Commonly known as: RETIN-A APPLY A THIN LAYER TO THE FACE NIGHTLY        History (reviewed): Past Medical History:  Diagnosis Date   Cancer ([place])    DVT (deep venous thrombosis) ([place]) 2015   Fibroids    uterine around age 56   Hypertension    Melanoma in situ of back ([place])    1991   Ovarian tumor 2011   LMP   Past Surgical History:  Procedure Laterality Date   BACK SURGERY  melanoma exicision from back   BREAST SURGERY     bilateral fibroadenomas/ cyst aspiration   FACIAL COSMETIC SURGERY     2006   fatty tumor removed from [facility]     OVARIAN CYST REMOVAL     x2 with rupture x2 in her 11s and 2s   SKIN SURGERY     between toes   Family History  Problem Relation Age of Onset   Heart attack Sister    Stroke Sister    Heart attack Maternal Aunt    Heart attack Paternal Uncle    Stroke Father    Heart attack Father    Heart attack Brother    Social History   Socioeconomic History   Marital status: Married    Spouse name: Kristina Whitaker   Number of children: 1   Years of education: 16   Highest education level:  Not on file  Occupational History   Occupation: retired    Comment: Chief Financial Officer  Tobacco Use   Smoking status: Never Smoker   Smokeless tobacco: Never Used  Substance and Sexual Activity   Alcohol use: Not Currently    Alcohol/week: 1.0 standard drinks    Types: 1 Glasses of wine per week   Drug use: No   Sexual activity: Never  Other Topics Concern   Not on file  Social History Narrative   Not on file   Social Determinants of Health   Financial Resource Strain:    Difficulty of Paying Living Expenses:   Food Insecurity:    Worried About Charity fundraiser in the Last Year:    Arboriculturist in the Last Year:   Transportation Needs:    Film/video editor (Medical):    Lack of Transportation (Non-Medical):   Physical Activity:    Days of Exercise per Week:    Minutes of Exercise per Session:   Stress:    Feeling of Stress :   Social Connections:    Frequency of Communication with Friends and Family:    Frequency of Social Gatherings with Friends and Family:    Attends Religious Services:    Active Member of Clubs or Organizations:    Attends Archivist Meetings:    Marital Status:     Activities of Daily Living In your present state of health, do you have any difficulty performing the following activities: 11/03/2019  Hearing? N  Vision? N  Difficulty concentrating or making decisions? Y  Comment sometimes forgetful  Walking or climbing stairs? N  Dressing or bathing? N  Doing errands, shopping? N  Preparing Food and eating ? N  Using the Toilet? N  In the past six months, have you accidently leaked urine? Y  Comment wears pad  Do you have problems with loss of bowel control? N  Managing your Medications? N  Managing your Finances? N  Housekeeping or managing your Housekeeping? N  Some recent data might be hidden    Patient Education/ Literacy How often do you need to have someone help you when you read instructions, pamphlets, or other  written materials from your doctor or pharmacy?: 1 - Never What is the last grade level you completed in school?: 4 years college  Exercise Current Exercise Habits: The patient does not participate in regular exercise at present, Exercise limited by: None identified  Diet Patient reports consuming 2 meals a day and 2 snack(s) a day Patient reports that her primary diet is: Regular Patient reports that she  does have regular access to food.   Depression Screen PHQ 2/9 Scores 11/03/2019 10/18/2019 01/17/2019 03/10/2018 02/25/2018 08/25/2017 04/09/2017  PHQ - 2 Score 0 1 1 0 0 0 0  PHQ- 9 Score - 2 - - - - -     Fall Risk Fall Risk  11/03/2019 10/18/2019 01/17/2019 03/10/2018 02/25/2018  Falls in the past year? 1 0 0 Yes Yes  Number falls in past yr: 0 - - 1 1  Injury with Fall? 0 - - - Yes  Comment - - - - Sprained right wrist.  Risk for fall due to : History of fall(s) - - - -  Follow up Falls prevention discussed - - - -     Objective:  Kristina Whitaker seemed alert and oriented and she participated appropriately during our telephone visit.  Blood Pressure Weight BMI  BP Readings from Last 3 Encounters:  10/18/19 (!) 178/80  01/17/19 (!) 180/75  03/10/18 (!) 159/82   Wt Readings from Last 3 Encounters:  10/18/19 157 lb (71.2 kg)  01/17/19 155 lb (70.3 kg)  03/10/18 157 lb 6.4 oz (71.4 kg)   BMI Readings from Last 1 Encounters:  10/18/19 30.66 kg/m    *Unable to obtain current vital signs, weight, and BMI due to telephone visit type  Hearing/Vision  Kristina Whitaker did not seem to have difficulty with hearing/understanding during the telephone conversation Reports that she has had a formal eye exam by an eye care professional within the past year Reports that she has not had a formal hearing evaluation within the past year *Unable to fully assess hearing and vision during telephone visit type  Cognitive Function: 6CIT Screen 11/03/2019  What Year? 0 points  What month? 0 points  What time? 0  points  Count back from 20 2 points  Months in reverse 2 points  Repeat phrase 0 points  Total Score 4   (Normal:0-7, Significant for Dysfunction: >8)  Normal Cognitive Function Screening: Yes   Immunization & Health Maintenance Record Immunization History  Administered Date(s) Administered   Hepatitis A 06/07/1998, 03/28/1999   Hepatitis B 11/15/2003   Influenza, Quadrivalent, Recombinant, Inj, Pf 05/25/2019   Meningococcal Conjugate 04/27/2003   Td 11/15/2003   Typhoid Inactivated 04/27/2003    Health Maintenance  Topic Date Due   DEXA SCAN  10/17/2020 (Originally 10/28/2014)   COLONOSCOPY  10/17/2020 (Originally 10/28/1999)   TETANUS/TDAP  10/17/2020 (Originally 11/14/2013)   Hepatitis C Screening  10/17/2020 (Originally 01-22-1951)   PNA vac Low Risk Adult (1 of 2 - PCV13) 10/17/2020 (Originally 10/28/2014)   MAMMOGRAM  06/18/2021   INFLUENZA VACCINE  Discontinued       Assessment  This is a routine wellness examination for Kristina Whitaker.  Health Maintenance: Due or Overdue There are no preventive care reminders to display for this patient.  Kristina Whitaker does not need a referral for Community Assistance: Care Management:   no Social Work:    no Prescription Assistance:  no Nutrition/Diabetes Education:  no   Plan:  Personalized Goals Goals Addressed             This Visit's Progress    Patient Stated       11/03/2019 AWV Goal: Exercise for General Health  Patient will verbalize understanding of the benefits of increased physical activity: Exercising regularly is important. It will improve your overall fitness, flexibility, and endurance. Regular exercise also will improve your overall health. It can help you control your weight, reduce stress,  and improve your bone density. Over the next year, patient will increase physical activity as tolerated with a goal of at least 150 minutes of moderate physical activity per week.  You can tell that you are exercising  at a moderate intensity if your heart starts beating faster and you start breathing faster but can still hold a conversation. Moderate-intensity exercise ideas include: Walking 1 mile (1.6 km) in about 15 minutes Biking Hiking Golfing Dancing Water aerobics Patient will verbalize understanding of everyday activities that increase physical activity by providing examples like the following: Yard work, such as: Sales promotion account executive Gardening Washing windows or floors Patient will be able to explain general safety guidelines for exercising:  Before you start a new exercise program, talk with your health care provider. Do not exercise so much that you hurt yourself, feel dizzy, or get very short of breath. Wear comfortable clothes and wear shoes with good support. Drink plenty of water while you exercise to prevent dehydration or heat stroke. Work out until your breathing and your heartbeat get faster.        Personalized Health Maintenance & Screening Recommendations    Lung Cancer Screening Recommended: no (Low Dose CT Chest recommended if Age 27-80 years, 30 pack-year currently smoking OR have quit w/in past 15 years) Hepatitis C Screening recommended: no HIV Screening recommended: no  Advanced Directives: Written information was not prepared per patient's request.  Referrals & Orders No orders of the defined types were placed in this encounter.   Follow-up Plan Follow-up with Sharion Balloon, FNP as planned   I have personally reviewed and noted the following in the patient's chart:   Medical and social history Use of alcohol, tobacco or illicit drugs  Current medications and supplements Functional ability and status Nutritional status Physical activity Advanced directives List of other physicians Hospitalizations, surgeries, and ER visits in previous 12 months Vitals Screenings to  include cognitive, depression, and falls Referrals and appointments  In addition, I have reviewed and discussed with Kristina Whitaker certain preventive protocols, quality metrics, and best practice recommendations. A written personalized care plan for preventive services as well as general preventive health recommendations is available and can be mailed to the patient at her request.      Baldomero Lamy, LPN  [id number]   I have reviewed and agree with the above AWV documentation.   Evelina Dun, FNP

## 2019-11-14 DIAGNOSIS — K045 Chronic apical periodontitis: Secondary | ICD-10-CM | POA: Diagnosis not present

## 2019-11-16 DIAGNOSIS — B078 Other viral warts: Secondary | ICD-10-CM | POA: Diagnosis not present

## 2019-11-27 DIAGNOSIS — H1045 Other chronic allergic conjunctivitis: Secondary | ICD-10-CM | POA: Diagnosis not present

## 2019-11-27 DIAGNOSIS — H2513 Age-related nuclear cataract, bilateral: Secondary | ICD-10-CM | POA: Diagnosis not present

## 2019-11-27 DIAGNOSIS — H11041 Peripheral pterygium, stationary, right eye: Secondary | ICD-10-CM | POA: Diagnosis not present

## 2019-11-27 DIAGNOSIS — H04123 Dry eye syndrome of bilateral lacrimal glands: Secondary | ICD-10-CM | POA: Diagnosis not present

## 2019-12-14 DIAGNOSIS — L720 Epidermal cyst: Secondary | ICD-10-CM | POA: Diagnosis not present

## 2019-12-14 DIAGNOSIS — L729 Follicular cyst of the skin and subcutaneous tissue, unspecified: Secondary | ICD-10-CM | POA: Diagnosis not present

## 2019-12-14 DIAGNOSIS — L820 Inflamed seborrheic keratosis: Secondary | ICD-10-CM | POA: Diagnosis not present

## 2020-01-24 ENCOUNTER — Telehealth: Payer: Self-pay | Admitting: *Deleted

## 2020-01-24 DIAGNOSIS — Z85820 Personal history of malignant melanoma of skin: Secondary | ICD-10-CM | POA: Diagnosis not present

## 2020-01-24 DIAGNOSIS — D225 Melanocytic nevi of trunk: Secondary | ICD-10-CM | POA: Diagnosis not present

## 2020-01-24 DIAGNOSIS — L729 Follicular cyst of the skin and subcutaneous tissue, unspecified: Secondary | ICD-10-CM | POA: Diagnosis not present

## 2020-01-24 DIAGNOSIS — Z08 Encounter for follow-up examination after completed treatment for malignant neoplasm: Secondary | ICD-10-CM | POA: Diagnosis not present

## 2020-01-24 NOTE — Telephone Encounter (Signed)
Fax from [facility] request for Fluticasone 2 sprays each nostril QD #48 Not on current med list. Last OV 10/18/19

## 2020-01-25 ENCOUNTER — Other Ambulatory Visit: Payer: Self-pay | Admitting: Family

## 2020-01-25 DIAGNOSIS — J301 Allergic rhinitis due to pollen: Secondary | ICD-10-CM

## 2020-01-25 DIAGNOSIS — H938X2 Other specified disorders of left ear: Secondary | ICD-10-CM

## 2020-01-25 MED ORDER — FLUTICASONE PROPIONATE 50 MCG/ACT NA SUSP: 2.0000 | Freq: Every day | NASAL | 6 refills | Status: DC

## 2020-01-25 NOTE — Addendum Note (Signed)
Addended by: Evelina Dun A on: 01/25/2020 04:00 PM   Modules accepted: Orders

## 2020-01-25 NOTE — Telephone Encounter (Signed)
Prescription sent to pharmacy.

## 2020-02-02 ENCOUNTER — Other Ambulatory Visit: Payer: Self-pay | Admitting: Family

## 2020-02-02 DIAGNOSIS — I10 Essential (primary) hypertension: Secondary | ICD-10-CM

## 2020-02-05 NOTE — Telephone Encounter (Signed)
30 day supply sent in but pt was seen 10/2019 for HTN and told to follow up in 2 weeks to recheck and pt never came in and doesn't have a follow up scheduled.  Needs follow up appt

## 2020-02-05 NOTE — Telephone Encounter (Signed)
Pt came in stating that she has been without her Losartan Rx for a few days and wanted to know what the issue was with why Kristina Whitaker hasn't sent in refills to the pharmacy. Explained to pt that [facility] sent in refills to [place] back in March 2021. Pt stated that she doesn't use [facility] anymore. [facility] [place]. Told pt I would send Kristina Whitaker a message about this to see if Rx at [facility] can be voided and [facility] to [facility]. [facility] said [facility] now to see if they could call [facility] and get them to send Rx.   NOTE: Deleted [facility] from pts chart so Rx's wont be sent there again, per pt request.

## 2020-02-22 DIAGNOSIS — L820 Inflamed seborrheic keratosis: Secondary | ICD-10-CM | POA: Diagnosis not present

## 2020-02-22 DIAGNOSIS — Z85820 Personal history of malignant melanoma of skin: Secondary | ICD-10-CM | POA: Diagnosis not present

## 2020-02-22 DIAGNOSIS — Z1283 Encounter for screening for malignant neoplasm of skin: Secondary | ICD-10-CM | POA: Diagnosis not present

## 2020-02-22 DIAGNOSIS — Z08 Encounter for follow-up examination after completed treatment for malignant neoplasm: Secondary | ICD-10-CM | POA: Diagnosis not present

## 2020-03-20 DIAGNOSIS — Z719 Counseling, unspecified: Secondary | ICD-10-CM | POA: Diagnosis not present

## 2020-03-20 DIAGNOSIS — Z1211 Encounter for screening for malignant neoplasm of colon: Secondary | ICD-10-CM | POA: Diagnosis not present

## 2020-03-20 DIAGNOSIS — I10 Essential (primary) hypertension: Secondary | ICD-10-CM | POA: Diagnosis not present

## 2020-03-20 DIAGNOSIS — Z87898 Personal history of other specified conditions: Secondary | ICD-10-CM | POA: Diagnosis not present

## 2020-03-26 DIAGNOSIS — E041 Nontoxic single thyroid nodule: Secondary | ICD-10-CM | POA: Diagnosis not present

## 2020-04-01 ENCOUNTER — Telehealth: Payer: Self-pay | Admitting: Family

## 2020-04-01 DIAGNOSIS — I10 Essential (primary) hypertension: Secondary | ICD-10-CM

## 2020-04-01 MED ORDER — LOSARTAN POTASSIUM 100 MG PO TABS: 100.0000 mg | ORAL_TABLET | Freq: Every day | ORAL | 0 refills | Status: DC

## 2020-04-01 NOTE — Telephone Encounter (Signed)
  Prescription Request  04/01/2020  What is the name of the medication or equipment? losartan (COZAAR) 100 MG tablet   Have you contacted your pharmacy to request a refill? (if applicable) YES  Which pharmacy would you like this sent to? [facility]    Patient notified that their request is being sent to the clinical staff for review and that they should receive a response within 2 business days.

## 2020-05-05 ENCOUNTER — Other Ambulatory Visit: Payer: Self-pay | Admitting: Family

## 2020-05-05 DIAGNOSIS — I10 Essential (primary) hypertension: Secondary | ICD-10-CM

## 2020-05-30 ENCOUNTER — Ambulatory Visit (INDEPENDENT_AMBULATORY_CARE_PROVIDER_SITE_OTHER): Payer: Medicare Other | Admitting: Family Medicine

## 2020-05-30 ENCOUNTER — Other Ambulatory Visit: Payer: Self-pay

## 2020-05-30 ENCOUNTER — Encounter: Payer: Self-pay | Admitting: Family Medicine

## 2020-05-30 VITALS — BP 123/64 | HR 79 | Temp 98.3°F | Ht 60.0 in | Wt 160.6 lb

## 2020-05-30 DIAGNOSIS — R609 Edema, unspecified: Secondary | ICD-10-CM

## 2020-05-30 DIAGNOSIS — W19XXXA Unspecified fall, initial encounter: Secondary | ICD-10-CM | POA: Diagnosis not present

## 2020-05-30 NOTE — Patient Instructions (Signed)
Peripheral Edema  Peripheral edema is swelling that is caused by a buildup of fluid. Peripheral edema most often affects the lower legs, ankles, and feet. It can also develop in the arms, hands, and face. The area of the body that has peripheral edema will look swollen. It may also feel heavy or warm. Your clothes may start to feel tight. Pressing on the area may make a temporary dent in your skin. You may not be able to move your swollen arm or leg as much as usual. There are many causes of peripheral edema. It can happen because of a complication of other conditions such as congestive heart failure, kidney disease, or a problem with your blood circulation. It also can be a side effect of certain medicines or because of an infection. It often happens to women during pregnancy. Sometimes, the cause is not known. Follow these instructions at home: Managing pain, stiffness, and swelling   Raise (elevate) your legs while you are sitting or lying down.  Move around often to prevent stiffness and to lessen swelling.  Do not sit or stand for long periods of time.  Wear support stockings as told by your health care provider. Medicines  Take over-the-counter and prescription medicines only as told by your health care provider.  Your health care provider may prescribe medicine to help your body get rid of excess water (diuretic). General instructions  Pay attention to any changes in your symptoms.  Follow instructions from your health care provider about limiting salt (sodium) in your diet. Sometimes, eating less salt may reduce swelling.  Moisturize skin daily to help prevent skin from cracking and draining.  Keep all follow-up visits as told by your health care provider. This is important. Contact a health care provider if you have:  A fever.  Edema that starts suddenly or is getting worse, especially if you are pregnant or have a medical condition.  Swelling in only one leg.  Increased  swelling, redness, or pain in one or both of your legs.  Drainage or sores at the area where you have edema. Get help right away if you:  Develop shortness of breath, especially when you are lying down.  Have pain in your chest or abdomen.  Feel weak.  Feel faint. Summary  Peripheral edema is swelling that is caused by a buildup of fluid. Peripheral edema most often affects the lower legs, ankles, and feet.  Move around often to prevent stiffness and to lessen swelling. Do not sit or stand for long periods of time.  Pay attention to any changes in your symptoms.  Contact a health care provider if you have edema that starts suddenly or is getting worse, especially if you are pregnant or have a medical condition.  Get help right away if you develop shortness of breath, especially when lying down. This information is not intended to replace advice given to you by your health care provider. Make sure you discuss any questions you have with your health care provider. Document Revised: 04/20/2018 Document Reviewed: 04/20/2018 Elsevier Patient Education  2020 [employer]  

## 2020-05-30 NOTE — Progress Notes (Signed)
Subjective: CC: fall, edema PCP: Sharion Balloon, FNP  VHQ:IONG Kristina Whitaker is a 69 y.o. female presenting to clinic today for:  1. Fall Kristina Whitaker fell asleep sitting up and then fell out of her bed while asleep and hit her head about 4 weeks ago. She was not evaluated after this. Since then she has been seeing things occasionally. Once she saw what looked like a bunch of little spiders on the bathroom floor. Another time she saw what looked like someone behind the bathroom curtain. She denies headaches, dizziness, changes in vision, changes in speech, or weakness. She has a history of brain tumors that received treatment in 2017 or 2018. She has a MRI follow up next month for this.   She was having back pain from twisting it when she fell. She got a massage yesterday and reports that if feels much better. She denies changes in bowel or bladder control. Denies numbness or tingling in her legs.   2. Edema She has had lower leg edema for about 3 weeks. She had a blood clot in her left leg about 6 years ago. 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  She is now retired. She has been eating more salt lately. She denies pain in her legs, warmth, tenderness, chest pain, or shortness of breath. She is concerned about a blood clot as she would like to have her massage therapist work on her lower legs.   She does not get her blood drawn and has not had blood work in 10 years.    Relevant past medical, surgical, family, and social history reviewed and updated as indicated.  Allergies and medications reviewed and updated.  Allergies  Allergen Reactions  . Diethylpropion   . Penicillins Hives  . Sulfa Drugs Cross Reactors Hives   Past Medical History:  Diagnosis Date  . Cancer ([place])   . DVT (deep venous thrombosis) ([place]) 2015  . Fibroids    uterine around age 51  . Hypertension   . Melanoma in situ of back ([place])    1991  . Ovarian tumor 2011   LMP     Current Outpatient Medications:  .  amLODipine (NORVASC) 5 MG tablet, Take 1 tablet (5 mg total) by mouth daily., Disp: 90 tablet, Rfl: 3 .  aspirin 81 MG tablet, Take 162 mg by mouth daily., Disp: , Rfl:  .  fluticasone (FLONASE) 50 MCG/ACT nasal spray, Place 2 sprays into both nostrils daily., Disp: 16 g, Rfl: 6 .  losartan (COZAAR) 100 MG tablet, Take 1 tablet (100 mg total) by mouth daily. (Needs to be seen before next refill), Disp: 30 tablet, Rfl: 0 .  rosuvastatin (CRESTOR) 10 MG tablet, Take 1 tablet (10 mg total) by mouth daily., Disp: 90 tablet, Rfl: 2 .  tretinoin (RETIN-A) 0.025 % cream, APPLY A THIN LAYER TO THE FACE NIGHTLY, Disp: , Rfl: 5 Social History   Socioeconomic History  . Marital status: Married    Spouse name: Timmothy Whitaker  . Number of children: 1  . Years of education: 110  . Highest education level: Not on file  Occupational History  . Occupation: retired    Comment: Chief Financial Officer  Tobacco Use  . Smoking status: Never Smoker  . Smokeless tobacco: Never Used  Vaping Use  . Vaping Use: Never assessed  Substance and Sexual Activity  . Alcohol use: Not Currently    Alcohol/week: 1.0 standard drink    Types: 1 Glasses  of wine per week  . Drug use: No  . Sexual activity: Never  Other Topics Concern  . 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 She lives at home with her husband Timmothy Whitaker. They have one grown daughter that lives locally. Alexianna enjoys traveling. She has an outside cat and an inside Careers adviser.    Social Determinants of Health   Financial Resource Strain:   . Difficulty of Paying Living Expenses: Not on file  Food Insecurity:   . Worried About Charity fundraiser in the Last Year: Not on file  . Ran Out of Food in the Last Year: Not on file  Transportation Needs:   . Lack of Transportation (Medical): Not on file  . Lack of Transportation (Non-Medical): Not on file  Physical Activity:   . Days of Exercise per Week: Not on file  .  Minutes of Exercise per Session: Not on file  Stress:   . Feeling of Stress : Not on file  Social Connections:   . Frequency of Communication with Friends and Family: Not on file  . Frequency of Social Gatherings with Friends and Family: Not on file  . Attends Religious Services: Not on file  . Active Member of Clubs or Organizations: Not on file  . Attends Archivist Meetings: Not on file  . Marital Status: Not on file  Intimate Partner Violence:   . Fear of Current or Ex-Partner: Not on file  . Emotionally Abused: Not on file  . Physically Abused: Not on file  . Sexually Abused: Not on file   Family History  Problem Relation Age of Onset  . Heart attack Sister   . Stroke Sister   . Heart attack Maternal Aunt   . Heart attack Paternal Uncle   . Stroke Father   . Heart attack Father   . Heart attack Brother     Review of Systems  Per HPI.   Objective: Office vital signs reviewed. BP 123/64   Pulse 79   Temp 98.3 F (36.8 C) (Temporal)   Ht 5' (1.524 m)   Wt 160 lb 9.6 oz (72.8 kg)   SpO2 100%   BMI 31.37 kg/m   Physical Examination:  Physical Exam Vitals and nursing note reviewed.  Constitutional:      General: She is not in acute distress.    Appearance: Normal appearance. She is not ill-appearing or toxic-appearing.  HENT:     Head: Normocephalic and atraumatic.  Cardiovascular:     Rate and Rhythm: Normal rate and regular rhythm.     Pulses: Normal pulses.     Heart sounds: Normal heart sounds. No murmur heard.   Pulmonary:     Effort: Pulmonary effort is normal. No respiratory distress.     Breath sounds: Normal breath sounds.  Musculoskeletal:        General: Normal range of motion.     Right lower leg: 2+ Edema present.     Left lower leg: 2+ Edema present.     Comments: Non pitting edema bilaterally. No warmth or TTP.   Skin:    General: Skin is warm and dry.     Capillary Refill: Capillary refill takes less than 2 seconds.    Neurological:     General: No focal deficit present.     Mental Status: She is alert and oriented to person, place, and time.     Gait: Gait normal.  Psychiatric:  Mood and Affect: Mood normal.        Behavior: Behavior normal.      Results for orders placed or performed during the hospital encounter of 01/14/10  Comprehensive metabolic panel  Result Value Ref Range   Sodium 139 135 - 145 mEq/L   Potassium 3.6 3.5 - 5.1 mEq/L   Chloride 104 96 - 112 mEq/L   CO2 27 19 - 32 mEq/L   Glucose, Bld 108 (H) 70 - 99 mg/dL   BUN 14 6 - 23 mg/dL   Creatinine, Ser 0.77 0.40 - 1.20 mg/dL   Calcium 9.4 8.4 - 10.5 mg/dL   Total Protein 7.7 6.0 - 8.3 g/dL   Albumin 4.6 3.5 - 5.2 g/dL   AST 17 0 - 37 U/L   ALT 19 0 - 35 U/L   Alkaline Phosphatase 78 39 - 117 U/L   Total Bilirubin 1.1 0.3 - 1.2 mg/dL   GFR calc non Af Amer >60 >60 mL/min   GFR calc Af Amer  >60 mL/min    >60        The eGFR has been calculated using the MDRD equation. This calculation has not been validated in all clinical situations. eGFR's persistently <60 mL/min signify possible Chronic Kidney Disease.  CBC  Result Value Ref Range   WBC 7.8 4.0 - 10.5 K/uL   RBC 4.87 3.87 - 5.11 MIL/uL   Hemoglobin 15.0 12.0 - 15.0 g/dL   HCT 42.5 36 - 46 %   MCV 87.2 78.0 - 100.0 fL   MCHC 35.4 30.0 - 36.0 g/dL   RDW 11.8 11.5 - 15.5 %   Platelets 195 150 - 400 K/uL  Differential  Result Value Ref Range   Neutrophils Relative % 48 43 - 77 %   Neutro Abs 3.8 1.7 - 7.7 K/uL   Lymphocytes Relative 43 12 - 46 %   Lymphs Abs 3.3 0.7 - 4.0 K/uL   Monocytes Relative 7 3 - 12 %   Monocytes Absolute 0.6 0.1 - 1.0 K/uL   Eosinophils Relative 1 0 - 5 %   Eosinophils Absolute 0.1 0.0 - 0.7 K/uL   Basophils Relative 1 0 - 1 %   Basophils Absolute 0.1 0.0 - 0.1 K/uL  Basic metabolic panel  Result Value Ref Range   Sodium 140 135 - 145 mEq/L   Potassium 4.8 3.5 - 5.1 mEq/L   Chloride 108 96 - 112 mEq/L   CO2 27 19 - 32  mEq/L   Glucose, Bld 177 (H) 70 - 99 mg/dL   BUN 8 6 - 23 mg/dL   Creatinine, Ser 0.88 0.40 - 1.20 mg/dL   Calcium 9.0 8.4 - 10.5 mg/dL   GFR calc non Af Amer >60 >60 mL/min   GFR calc Af Amer  >60 mL/min    >60        The eGFR has been calculated using the MDRD equation. This calculation has not been validated in all clinical situations. eGFR's persistently <60 mL/min signify possible Chronic Kidney Disease.  CBC  Result Value Ref Range   WBC 21.6 (H) 4.0 - 10.5 K/uL   RBC 3.98 3.87 - 5.11 MIL/uL   Hemoglobin 12.4 12.0 - 15.0 g/dL   HCT 34.9 (L) 36 - 46 %   MCV 87.6 78.0 - 100.0 fL   MCHC 35.6 30.0 - 36.0 g/dL   RDW 12.0 11.5 - 15.5 %   Platelets 195 150 - 400 K/uL  Basic metabolic panel  Result  Value Ref Range   Sodium 139 135 - 145 mEq/L   Potassium 3.8 RESULT REPEATED AND VERIFIED 3.5 - 5.1 mEq/L   Chloride 107 96 - 112 mEq/L   CO2 27 19 - 32 mEq/L   Glucose, Bld 114 (H) 70 - 99 mg/dL   BUN 7 6 - 23 mg/dL   Creatinine, Ser 0.78 0.40 - 1.20 mg/dL   Calcium 8.9 8.4 - 10.5 mg/dL   GFR calc non Af Amer >60 >60 mL/min   GFR calc Af Amer  >60 mL/min    >60        The eGFR has been calculated using the MDRD equation. This calculation has not been validated in all clinical situations. eGFR's persistently <60 mL/min signify possible Chronic Kidney Disease.  CBC  Result Value Ref Range   WBC 12.9 (H) 4.0 - 10.5 K/uL   RBC 3.83 (L) 3.87 - 5.11 MIL/uL   Hemoglobin 11.8 (L) 12.0 - 15.0 g/dL   HCT 33.6 (L) 36 - 46 %   MCV 87.9 78.0 - 100.0 fL   MCHC 34.9 30.0 - 36.0 g/dL   RDW 11.6 11.5 - 15.5 %   Platelets 168 150 - 400 K/uL  Type and screen  Result Value Ref Range   ABO/RH(D) O POS    Antibody Screen NEG    Sample Expiration 01/17/2010   ABO/Rh  Result Value Ref Range   ABO/RH(D) O POS      Assessment/ Plan: Carnetta was seen today for fall.  Diagnoses and all orders for this visit:  Fall, initial encounter No imaging order since it has been 4 weeks since  the fall. She has a follow up MRI already scheduled for next month. Fall precautions.   Peripheral edema Edema is equal bilaterally, no warmth or tenderness. Patient reassured. Diuretic not provided since patient declines lab work and no record of lab work since 2011. Compression stockings, low salt diet. Handout given.   Return to office for new or worsening symptoms, or if symptoms persist.   The above assessment and management plan was discussed with the patient. The patient verbalized understanding of and has agreed to the management plan. Patient is aware to call the clinic if symptoms persist or worsen. Patient is aware when to return to the clinic for a follow-up visit. Patient educated on when it is appropriate to go to the emergency department.   Marjorie Smolder, FNP-C [facility] [address] [place], [place] [address] [phone]

## 2020-06-10 ENCOUNTER — Other Ambulatory Visit: Payer: Self-pay | Admitting: Family

## 2020-06-10 DIAGNOSIS — E785 Hyperlipidemia, unspecified: Secondary | ICD-10-CM

## 2020-06-13 DIAGNOSIS — M546 Pain in thoracic spine: Secondary | ICD-10-CM | POA: Diagnosis not present

## 2020-06-13 DIAGNOSIS — M542 Cervicalgia: Secondary | ICD-10-CM | POA: Diagnosis not present

## 2020-06-13 DIAGNOSIS — M50123 Cervical disc disorder at C6-C7 level with radiculopathy: Secondary | ICD-10-CM | POA: Diagnosis not present

## 2020-06-13 DIAGNOSIS — M545 Low back pain, unspecified: Secondary | ICD-10-CM | POA: Diagnosis not present

## 2020-06-13 DIAGNOSIS — M5459 Other low back pain: Secondary | ICD-10-CM | POA: Diagnosis not present

## 2020-06-17 ENCOUNTER — Other Ambulatory Visit: Payer: Self-pay | Admitting: Family

## 2020-06-17 DIAGNOSIS — E785 Hyperlipidemia, unspecified: Secondary | ICD-10-CM

## 2020-06-17 NOTE — Telephone Encounter (Signed)
[place]. NTBS 30 days given 06/10/20

## 2020-06-18 NOTE — Telephone Encounter (Signed)
Appointment scheduled.

## 2020-06-19 DIAGNOSIS — M542 Cervicalgia: Secondary | ICD-10-CM | POA: Diagnosis not present

## 2020-06-19 DIAGNOSIS — M5459 Other low back pain: Secondary | ICD-10-CM | POA: Diagnosis not present

## 2020-06-19 DIAGNOSIS — M50123 Cervical disc disorder at C6-C7 level with radiculopathy: Secondary | ICD-10-CM | POA: Diagnosis not present

## 2020-06-19 DIAGNOSIS — M546 Pain in thoracic spine: Secondary | ICD-10-CM | POA: Diagnosis not present

## 2020-06-19 DIAGNOSIS — M545 Low back pain, unspecified: Secondary | ICD-10-CM | POA: Diagnosis not present

## 2020-06-20 ENCOUNTER — Ambulatory Visit (INDEPENDENT_AMBULATORY_CARE_PROVIDER_SITE_OTHER): Payer: Medicare Other | Admitting: Family

## 2020-06-20 ENCOUNTER — Encounter: Payer: Self-pay | Admitting: Family

## 2020-06-20 DIAGNOSIS — Y92009 Unspecified place in unspecified non-institutional (private) residence as the place of occurrence of the external cause: Secondary | ICD-10-CM | POA: Diagnosis not present

## 2020-06-20 DIAGNOSIS — M50123 Cervical disc disorder at C6-C7 level with radiculopathy: Secondary | ICD-10-CM | POA: Diagnosis not present

## 2020-06-20 DIAGNOSIS — Z1159 Encounter for screening for other viral diseases: Secondary | ICD-10-CM

## 2020-06-20 DIAGNOSIS — E785 Hyperlipidemia, unspecified: Secondary | ICD-10-CM | POA: Diagnosis not present

## 2020-06-20 DIAGNOSIS — M545 Low back pain, unspecified: Secondary | ICD-10-CM | POA: Diagnosis not present

## 2020-06-20 DIAGNOSIS — I10 Essential (primary) hypertension: Secondary | ICD-10-CM

## 2020-06-20 DIAGNOSIS — W19XXXD Unspecified fall, subsequent encounter: Secondary | ICD-10-CM

## 2020-06-20 DIAGNOSIS — M542 Cervicalgia: Secondary | ICD-10-CM | POA: Diagnosis not present

## 2020-06-20 DIAGNOSIS — M5459 Other low back pain: Secondary | ICD-10-CM | POA: Diagnosis not present

## 2020-06-20 DIAGNOSIS — M546 Pain in thoracic spine: Secondary | ICD-10-CM | POA: Diagnosis not present

## 2020-06-20 DIAGNOSIS — D329 Benign neoplasm of meninges, unspecified: Secondary | ICD-10-CM | POA: Diagnosis not present

## 2020-06-20 MED ORDER — ROSUVASTATIN CALCIUM 10 MG PO TABS: 10.0000 mg | ORAL_TABLET | Freq: Every day | ORAL | 0 refills | Status: DC

## 2020-06-20 MED ORDER — AMLODIPINE BESYLATE 5 MG PO TABS: 5.0000 mg | ORAL_TABLET | Freq: Every day | ORAL | 0 refills | Status: DC

## 2020-06-20 MED ORDER — LOSARTAN POTASSIUM 100 MG PO TABS: 100.0000 mg | ORAL_TABLET | Freq: Every day | ORAL | 0 refills | Status: DC

## 2020-06-20 NOTE — Progress Notes (Signed)
Virtual Visit via telephone Note Due to COVID-19 pandemic this visit was conducted virtually. This visit type was conducted due to national recommendations for restrictions regarding the COVID-19 Pandemic (e.g. social distancing, sheltering in place) in an effort to limit this patient's exposure and mitigate transmission in our community. All issues noted in this document were discussed and addressed.  A physical exam was not performed with this format.  I connected with Kristina Whitaker on 06/20/20 at 9:15 AM  by telephone and verified that I am speaking with the correct person using two identifiers. Kristina Whitaker is currently located in car and no one is currently with her during visit. The provider, Evelina Dun, FNP is located in their office at time of visit.  I discussed the limitations, risks, security and privacy concerns of performing an evaluation and management service by telephone and the availability of in person appointments. I also discussed with the patient that there may be a patient responsible charge related to this service. The patient expressed understanding and agreed to proceed.   History and Present Illness:  PT presents to the office today for chronic follow up. She fell in October and continues to have ankle pain. She is scheduled for MRI this month of hx of meningioma. She is followed by Neurosurgeon once a year for this.   Hypertension This is a chronic problem. The current episode started more than 1 year ago. The problem has been resolved since onset. The problem is controlled. Associated symptoms include peripheral edema. Pertinent negatives include no malaise/fatigue or shortness of breath. Risk factors for coronary artery disease include dyslipidemia, obesity and sedentary lifestyle. The current treatment provides moderate improvement. There is no history of CVA or heart failure.  Hyperlipidemia This is a chronic problem. The current episode started more than 1 year  ago. The problem is uncontrolled. Recent lipid tests were reviewed and are high. Pertinent negatives include no shortness of breath. Current antihyperlipidemic treatment includes statins. The current treatment provides moderate improvement of lipids. Risk factors for coronary artery disease include dyslipidemia, female sex, a sedentary lifestyle and hypertension.      Review of Systems  Constitutional: Negative for malaise/fatigue.  Respiratory: Negative for shortness of breath.   All other systems reviewed and are negative.    Observations/Objective: No SOB Or distress noted  Assessment and Plan: Kristina Whitaker comes in today with chief complaint of No chief complaint on file.   Diagnosis and orders addressed:  1. Primary hypertension - CMP14+EGFR; Future - CBC with Differential/Platelet; Future - TSH; Future  2. Hyperlipidemia, unspecified hyperlipidemia type - CMP14+EGFR; Future - CBC with Differential/Platelet; Future - Lipid panel; Future  3. Fall in home, subsequent encounter - CMP14+EGFR; Future - CBC with Differential/Platelet; Future - TSH; Future  4. Need for hepatitis C screening test - CMP14+EGFR; Future - CBC with Differential/Platelet; Future - Hepatitis C antibody; Future  5. Meningioma ([place]) - CMP14+EGFR; Future - CBC with Differential/Platelet; Future   Labs pending, pt states she will be in the hospital in 2-3 weeks and will get her labs drawn at that time. She is very scared of needles and does not want to get drawn twice. We had a discussion that I could not continue to refill her medications if she does not have lab work.  Health Maintenance reviewed Diet and exercise encouraged  Follow up plan: 6 months     I discussed the assessment and treatment plan with the patient. The patient was provided an  opportunity to ask questions and all were answered. The patient agreed with the plan and demonstrated an understanding of the instructions.   The  patient was advised to call back or seek an in-person evaluation if the symptoms worsen or if the condition fails to improve as anticipated.  The above assessment and management plan was discussed with the patient. The patient verbalized understanding of and has agreed to the management plan. Patient is aware to call the clinic if symptoms persist or worsen. Patient is aware when to return to the clinic for a follow-up visit. Patient educated on when it is appropriate to go to the emergency department.   Time call ended:  9:36 AM  I provided 21 minutes of non-face-to-face time during this encounter.    Evelina Dun, FNP

## 2020-06-24 DIAGNOSIS — M546 Pain in thoracic spine: Secondary | ICD-10-CM | POA: Diagnosis not present

## 2020-06-24 DIAGNOSIS — M5459 Other low back pain: Secondary | ICD-10-CM | POA: Diagnosis not present

## 2020-06-24 DIAGNOSIS — M50123 Cervical disc disorder at C6-C7 level with radiculopathy: Secondary | ICD-10-CM | POA: Diagnosis not present

## 2020-06-24 DIAGNOSIS — M542 Cervicalgia: Secondary | ICD-10-CM | POA: Diagnosis not present

## 2020-06-24 DIAGNOSIS — M545 Low back pain, unspecified: Secondary | ICD-10-CM | POA: Diagnosis not present

## 2020-07-03 DIAGNOSIS — D320 Benign neoplasm of cerebral meninges: Secondary | ICD-10-CM | POA: Diagnosis not present

## 2020-07-03 DIAGNOSIS — D329 Benign neoplasm of meninges, unspecified: Secondary | ICD-10-CM | POA: Diagnosis not present

## 2020-07-03 DIAGNOSIS — Z48811 Encounter for surgical aftercare following surgery on the nervous system: Secondary | ICD-10-CM | POA: Diagnosis not present

## 2020-07-03 DIAGNOSIS — Z923 Personal history of irradiation: Secondary | ICD-10-CM | POA: Diagnosis not present

## 2020-07-08 DIAGNOSIS — M546 Pain in thoracic spine: Secondary | ICD-10-CM | POA: Diagnosis not present

## 2020-07-08 DIAGNOSIS — M545 Low back pain, unspecified: Secondary | ICD-10-CM | POA: Diagnosis not present

## 2020-07-08 DIAGNOSIS — M50123 Cervical disc disorder at C6-C7 level with radiculopathy: Secondary | ICD-10-CM | POA: Diagnosis not present

## 2020-07-08 DIAGNOSIS — M542 Cervicalgia: Secondary | ICD-10-CM | POA: Diagnosis not present

## 2020-07-08 DIAGNOSIS — M5459 Other low back pain: Secondary | ICD-10-CM | POA: Diagnosis not present

## 2020-07-09 DIAGNOSIS — M545 Low back pain, unspecified: Secondary | ICD-10-CM | POA: Diagnosis not present

## 2020-07-09 DIAGNOSIS — M546 Pain in thoracic spine: Secondary | ICD-10-CM | POA: Diagnosis not present

## 2020-07-09 DIAGNOSIS — M5459 Other low back pain: Secondary | ICD-10-CM | POA: Diagnosis not present

## 2020-07-09 DIAGNOSIS — M50123 Cervical disc disorder at C6-C7 level with radiculopathy: Secondary | ICD-10-CM | POA: Diagnosis not present

## 2020-07-09 DIAGNOSIS — M542 Cervicalgia: Secondary | ICD-10-CM | POA: Diagnosis not present

## 2020-07-11 ENCOUNTER — Other Ambulatory Visit: Payer: Self-pay

## 2020-07-11 ENCOUNTER — Ambulatory Visit (INDEPENDENT_AMBULATORY_CARE_PROVIDER_SITE_OTHER): Payer: Medicare Other | Admitting: Family

## 2020-07-11 ENCOUNTER — Encounter: Payer: Self-pay | Admitting: Family

## 2020-07-11 VITALS — BP 108/62 | HR 80 | Temp 98.2°F | Ht 60.0 in | Wt 159.0 lb

## 2020-07-11 DIAGNOSIS — D329 Benign neoplasm of meninges, unspecified: Secondary | ICD-10-CM | POA: Diagnosis not present

## 2020-07-11 DIAGNOSIS — I10 Essential (primary) hypertension: Secondary | ICD-10-CM | POA: Diagnosis not present

## 2020-07-11 DIAGNOSIS — M7989 Other specified soft tissue disorders: Secondary | ICD-10-CM

## 2020-07-11 DIAGNOSIS — Z86718 Personal history of other venous thrombosis and embolism: Secondary | ICD-10-CM

## 2020-07-11 DIAGNOSIS — E785 Hyperlipidemia, unspecified: Secondary | ICD-10-CM | POA: Diagnosis not present

## 2020-07-11 NOTE — Progress Notes (Signed)
Subjective:    Patient ID: Kristina Whitaker, female    DOB: [birth year], 69 y.o.   MRN: [id number]  Chief Complaint  Patient presents with  . Hypertension  . Fall    swelling in legs     PT presents to the office today for chronic follow up. She fell in October and continues to have mild left leg swelling. She is scheduled for MRI this month of hx of meningioma. She is followed by Neurosurgeon once a year for this.  Hypertension This is a chronic problem. The current episode started more than 1 year ago. The problem has been waxing and waning since onset. Associated symptoms include malaise/fatigue and peripheral edema. Pertinent negatives include no shortness of breath. Risk factors for coronary artery disease include dyslipidemia, obesity and sedentary lifestyle. The current treatment provides moderate improvement.  Fall  Hyperlipidemia This is a chronic problem. The current episode started more than 1 year ago. Exacerbating diseases include obesity. Pertinent negatives include no shortness of breath. She is currently on no antihyperlipidemic treatment. Risk factors for coronary artery disease include dyslipidemia, hypertension, a sedentary lifestyle and post-menopausal.      Review of Systems  Constitutional: Positive for malaise/fatigue.  Respiratory: Negative for shortness of breath.   All other systems reviewed and are negative.      Objective:   Physical Exam Vitals reviewed.  Constitutional:      General: She is not in acute distress.    Appearance: She is well-developed.  HENT:     Head: Normocephalic and atraumatic.     Right Ear: Tympanic membrane normal.     Left Ear: Tympanic membrane normal.  Eyes:     Pupils: Pupils are equal, round, and reactive to light.  Neck:     Thyroid: No thyromegaly.  Cardiovascular:     Rate and Rhythm: Normal rate and regular rhythm.     Heart sounds: Normal heart sounds. No murmur heard.   Pulmonary:     Effort: Pulmonary effort  is normal. No respiratory distress.     Breath sounds: Normal breath sounds. No wheezing.  Abdominal:     General: Bowel sounds are normal. There is no distension.     Palpations: Abdomen is soft.     Tenderness: There is no abdominal tenderness.  Musculoskeletal:        General: No tenderness. Normal range of motion.     Cervical back: Normal range of motion and neck supple.     Right lower leg: No edema.     Left lower leg: No edema.     Comments: No swelling or tenderness noted, negative Homan's sign  Skin:    General: Skin is warm and dry.  Neurological:     Mental Status: She is alert and oriented to person, place, and time.     Cranial Nerves: No cranial nerve deficit.     Deep Tendon Reflexes: Reflexes are normal and symmetric.  Psychiatric:        Behavior: Behavior normal.        Thought Content: Thought content normal.        Judgment: Judgment normal.       BP 108/62   Pulse 80   Temp 98.2 F (36.8 C) (Temporal)   Ht 5' (1.524 m)   Wt 159 lb (72.1 kg)   SpO2 100%   BMI 31.05 kg/m      Assessment & Plan:  Kristina Whitaker comes in today with chief complaint  of Hypertension and Fall (swelling in legs )   Diagnosis and orders addressed:  1. Meningioma ([place])  2. Primary hypertension  3. Hyperlipidemia, unspecified hyperlipidemia type  4. History of DVT (deep vein thrombosis)  5. Leg swelling No swelling on exam   Long discussion with patient about lab work. She states she is not going to get lab work. Our last lab work I can see was 2018. I discussed we could not continue her medications. She states she will stop her medication.  I offered her a small dose anxiety medication to take prior and she refused, stating she was not going to go through all the poking and missing her veins.  Health Maintenance reviewed Diet and exercise encouraged  Follow up plan: 6 months or follow up another provider.    Evelina Dun, FNP

## 2020-07-11 NOTE — Patient Instructions (Signed)
Health Maintenance After Age 70 After age 70, you are at a higher risk for certain long-term diseases and infections as well as injuries from falls. Falls are a major cause of broken bones and head injuries in people who are older than age 70. Getting regular preventive care can help to keep you healthy and well. Preventive care includes getting regular testing and making lifestyle changes as recommended by your health care provider. Talk with your health care provider about:  Which screenings and tests you should have. A screening is a test that checks for a disease when you have no symptoms.  A diet and exercise plan that is right for you. What should I know about screenings and tests to prevent falls? Screening and testing are the best ways to find a health problem early. Early diagnosis and treatment give you the best chance of managing medical conditions that are common after age 70. Certain conditions and lifestyle choices may make you more likely to have a fall. Your health care provider may recommend:  Regular vision checks. Poor vision and conditions such as cataracts can make you more likely to have a fall. If you wear glasses, make sure to get your prescription updated if your vision changes.  Medicine review. Work with your health care provider to regularly review all of the medicines you are taking, including over-the-counter medicines. Ask your health care provider about any side effects that may make you more likely to have a fall. Tell your health care provider if any medicines that you take make you feel dizzy or sleepy.  Osteoporosis screening. Osteoporosis is a condition that causes the bones to get weaker. This can make the bones weak and cause them to break more easily.  Blood pressure screening. Blood pressure changes and medicines to control blood pressure can make you feel dizzy.  Strength and balance checks. Your health care provider may recommend certain tests to check your  strength and balance while standing, walking, or changing positions.  Foot health exam. Foot pain and numbness, as well as not wearing proper footwear, can make you more likely to have a fall.  Depression screening. You may be more likely to have a fall if you have a fear of falling, feel emotionally low, or feel unable to do activities that you used to do.  Alcohol use screening. Using too much alcohol can affect your balance and may make you more likely to have a fall. What actions can I take to lower my risk of falls? General instructions  Talk with your health care provider about your risks for falling. Tell your health care provider if: ? You fall. Be sure to tell your health care provider about all falls, even ones that seem minor. ? You feel dizzy, sleepy, or off-balance.  Take over-the-counter and prescription medicines only as told by your health care provider. These include any supplements.  Eat a healthy diet and maintain a healthy weight. A healthy diet includes low-fat dairy products, low-fat (lean) meats, and fiber from whole grains, beans, and lots of fruits and vegetables. Home safety  Remove any tripping hazards, such as rugs, cords, and clutter.  Install safety equipment such as grab bars in bathrooms and safety rails on stairs.  Keep rooms and walkways well-lit. Activity   Follow a regular exercise program to stay fit. This will help you maintain your balance. Ask your health care provider what types of exercise are appropriate for you.  If you need a cane or   walker, use it as recommended by your health care provider.  Wear supportive shoes that have nonskid soles. Lifestyle  Do not drink alcohol if your health care provider tells you not to drink.  If you drink alcohol, limit how much you have: ? 0-1 drink a day for women. ? 0-2 drinks a day for men.  Be aware of how much alcohol is in your drink. In the [place] one drink equals one typical bottle of beer (12  oz), one-half glass of wine (5 oz), or one shot of hard liquor (1 oz).  Do not use any products that contain nicotine or tobacco, such as cigarettes and e-cigarettes. If you need help quitting, ask your health care provider. Summary  Having a healthy lifestyle and getting preventive care can help to protect your health and wellness after age 70.  Screening and testing are the best way to find a health problem early and help you avoid having a fall. Early diagnosis and treatment give you the best chance for managing medical conditions that are more common for people who are older than age 70.  Falls are a major cause of broken bones and head injuries in people who are older than age 70. Take precautions to prevent a fall at home.  Work with your health care provider to learn what changes you can make to improve your health and wellness and to prevent falls. This information is not intended to replace advice given to you by your health care provider. Make sure you discuss any questions you have with your health care provider. Document Revised: 11/17/2018 Document Reviewed: 06/09/2017 Elsevier Patient Education  2020 [employer]  

## 2020-07-12 DIAGNOSIS — M546 Pain in thoracic spine: Secondary | ICD-10-CM | POA: Diagnosis not present

## 2020-07-12 DIAGNOSIS — M542 Cervicalgia: Secondary | ICD-10-CM | POA: Diagnosis not present

## 2020-07-12 DIAGNOSIS — M545 Low back pain, unspecified: Secondary | ICD-10-CM | POA: Diagnosis not present

## 2020-07-12 DIAGNOSIS — M50123 Cervical disc disorder at C6-C7 level with radiculopathy: Secondary | ICD-10-CM | POA: Diagnosis not present

## 2020-07-12 DIAGNOSIS — M5459 Other low back pain: Secondary | ICD-10-CM | POA: Diagnosis not present

## 2020-07-15 DIAGNOSIS — M5459 Other low back pain: Secondary | ICD-10-CM | POA: Diagnosis not present

## 2020-07-15 DIAGNOSIS — M546 Pain in thoracic spine: Secondary | ICD-10-CM | POA: Diagnosis not present

## 2020-07-15 DIAGNOSIS — M50123 Cervical disc disorder at C6-C7 level with radiculopathy: Secondary | ICD-10-CM | POA: Diagnosis not present

## 2020-07-15 DIAGNOSIS — M545 Low back pain, unspecified: Secondary | ICD-10-CM | POA: Diagnosis not present

## 2020-07-15 DIAGNOSIS — M542 Cervicalgia: Secondary | ICD-10-CM | POA: Diagnosis not present

## 2020-07-16 DIAGNOSIS — M5459 Other low back pain: Secondary | ICD-10-CM | POA: Diagnosis not present

## 2020-07-16 DIAGNOSIS — M545 Low back pain, unspecified: Secondary | ICD-10-CM | POA: Diagnosis not present

## 2020-07-16 DIAGNOSIS — M50123 Cervical disc disorder at C6-C7 level with radiculopathy: Secondary | ICD-10-CM | POA: Diagnosis not present

## 2020-07-16 DIAGNOSIS — M546 Pain in thoracic spine: Secondary | ICD-10-CM | POA: Diagnosis not present

## 2020-07-16 DIAGNOSIS — M542 Cervicalgia: Secondary | ICD-10-CM | POA: Diagnosis not present

## 2020-07-17 DIAGNOSIS — H35372 Puckering of macula, left eye: Secondary | ICD-10-CM | POA: Diagnosis not present

## 2020-07-17 DIAGNOSIS — H40003 Preglaucoma, unspecified, bilateral: Secondary | ICD-10-CM | POA: Diagnosis not present

## 2020-07-17 DIAGNOSIS — H2513 Age-related nuclear cataract, bilateral: Secondary | ICD-10-CM | POA: Diagnosis not present

## 2020-07-17 DIAGNOSIS — D2310 Other benign neoplasm of skin of unspecified eyelid, including canthus: Secondary | ICD-10-CM | POA: Diagnosis not present

## 2020-07-17 DIAGNOSIS — H35342 Macular cyst, hole, or pseudohole, left eye: Secondary | ICD-10-CM | POA: Diagnosis not present

## 2020-07-17 DIAGNOSIS — H35379 Puckering of macula, unspecified eye: Secondary | ICD-10-CM | POA: Diagnosis not present

## 2020-07-18 DIAGNOSIS — M5459 Other low back pain: Secondary | ICD-10-CM | POA: Diagnosis not present

## 2020-07-18 DIAGNOSIS — M542 Cervicalgia: Secondary | ICD-10-CM | POA: Diagnosis not present

## 2020-07-18 DIAGNOSIS — M50123 Cervical disc disorder at C6-C7 level with radiculopathy: Secondary | ICD-10-CM | POA: Diagnosis not present

## 2020-07-18 DIAGNOSIS — M545 Low back pain, unspecified: Secondary | ICD-10-CM | POA: Diagnosis not present

## 2020-07-18 DIAGNOSIS — M546 Pain in thoracic spine: Secondary | ICD-10-CM | POA: Diagnosis not present

## 2020-07-22 DIAGNOSIS — M5459 Other low back pain: Secondary | ICD-10-CM | POA: Diagnosis not present

## 2020-07-22 DIAGNOSIS — M546 Pain in thoracic spine: Secondary | ICD-10-CM | POA: Diagnosis not present

## 2020-07-22 DIAGNOSIS — M545 Low back pain, unspecified: Secondary | ICD-10-CM | POA: Diagnosis not present

## 2020-07-22 DIAGNOSIS — M542 Cervicalgia: Secondary | ICD-10-CM | POA: Diagnosis not present

## 2020-07-22 DIAGNOSIS — M50123 Cervical disc disorder at C6-C7 level with radiculopathy: Secondary | ICD-10-CM | POA: Diagnosis not present

## 2020-07-24 DIAGNOSIS — Z87898 Personal history of other specified conditions: Secondary | ICD-10-CM | POA: Diagnosis not present

## 2020-07-24 DIAGNOSIS — R413 Other amnesia: Secondary | ICD-10-CM | POA: Diagnosis not present

## 2020-07-25 DIAGNOSIS — M50123 Cervical disc disorder at C6-C7 level with radiculopathy: Secondary | ICD-10-CM | POA: Diagnosis not present

## 2020-07-25 DIAGNOSIS — M546 Pain in thoracic spine: Secondary | ICD-10-CM | POA: Diagnosis not present

## 2020-07-25 DIAGNOSIS — M542 Cervicalgia: Secondary | ICD-10-CM | POA: Diagnosis not present

## 2020-07-25 DIAGNOSIS — M545 Low back pain, unspecified: Secondary | ICD-10-CM | POA: Diagnosis not present

## 2020-07-25 DIAGNOSIS — M5459 Other low back pain: Secondary | ICD-10-CM | POA: Diagnosis not present

## 2020-07-26 DIAGNOSIS — M5459 Other low back pain: Secondary | ICD-10-CM | POA: Diagnosis not present

## 2020-07-26 DIAGNOSIS — M50123 Cervical disc disorder at C6-C7 level with radiculopathy: Secondary | ICD-10-CM | POA: Diagnosis not present

## 2020-07-26 DIAGNOSIS — M546 Pain in thoracic spine: Secondary | ICD-10-CM | POA: Diagnosis not present

## 2020-07-26 DIAGNOSIS — M542 Cervicalgia: Secondary | ICD-10-CM | POA: Diagnosis not present

## 2020-07-26 DIAGNOSIS — M545 Low back pain, unspecified: Secondary | ICD-10-CM | POA: Diagnosis not present

## 2020-08-06 ENCOUNTER — Other Ambulatory Visit (HOSPITAL_COMMUNITY): Payer: Self-pay | Admitting: Family

## 2020-08-06 DIAGNOSIS — Z1231 Encounter for screening mammogram for malignant neoplasm of breast: Secondary | ICD-10-CM

## 2020-08-22 ENCOUNTER — Encounter: Payer: Self-pay | Admitting: Family

## 2020-08-22 ENCOUNTER — Other Ambulatory Visit: Payer: Self-pay

## 2020-08-22 ENCOUNTER — Ambulatory Visit (INDEPENDENT_AMBULATORY_CARE_PROVIDER_SITE_OTHER): Payer: Medicare HMO | Admitting: Family

## 2020-08-22 VITALS — BP 163/89 | HR 79 | Temp 95.0°F | Ht 60.0 in | Wt 157.6 lb

## 2020-08-22 DIAGNOSIS — W06XXXA Fall from bed, initial encounter: Secondary | ICD-10-CM | POA: Diagnosis not present

## 2020-08-22 DIAGNOSIS — Z85820 Personal history of malignant melanoma of skin: Secondary | ICD-10-CM | POA: Diagnosis not present

## 2020-08-22 DIAGNOSIS — D229 Melanocytic nevi, unspecified: Secondary | ICD-10-CM | POA: Diagnosis not present

## 2020-08-22 DIAGNOSIS — M25552 Pain in left hip: Secondary | ICD-10-CM

## 2020-08-22 DIAGNOSIS — I10 Essential (primary) hypertension: Secondary | ICD-10-CM | POA: Diagnosis not present

## 2020-08-22 NOTE — Progress Notes (Signed)
Subjective:    Patient ID: Kristina Whitaker, female    DOB: 1951-04-09, 70 y.o.   MRN: [id number]  Chief Complaint  Patient presents with  . Nevus    Changed in color on left leg  . Fall    Last week and hurt left thigh and right pinky finger    PT presents to the office today discuss changes in nevus. She reports she has had this nevus for years, but over the last few weeks she has noticed it has changed from brown to dark black. She has a hx of melanoma.   She reports last week her dog pushed her out of bed about a week ago. She fell onto her right hand and left hip. She reports the pain is improving and is a 3 out 10 now with mild bruising.  Fall The accident occurred more than 1 week ago. She landed on hard floor. There was no blood loss. The point of impact was the left hip (right hand).  Hypertension This is a chronic problem. The current episode started more than 1 year ago. The problem has been waxing and waning since onset. The problem is uncontrolled. Pertinent negatives include no malaise/fatigue, peripheral edema or shortness of breath. Past treatments include nothing. The current treatment provides no improvement.      Review of Systems  Constitutional: Negative for malaise/fatigue.  Respiratory: Negative for shortness of breath.   All other systems reviewed and are negative.      Objective:   Physical Exam Vitals reviewed.  Constitutional:      General: She is not in acute distress.    Appearance: She is well-developed and well-nourished.  HENT:     Head: Normocephalic and atraumatic.     Mouth/Throat:     Mouth: Oropharynx is clear and moist.  Eyes:     Pupils: Pupils are equal, round, and reactive to light.  Neck:     Thyroid: No thyromegaly.  Cardiovascular:     Rate and Rhythm: Normal rate and regular rhythm.     Pulses: Intact distal pulses.     Heart sounds: Normal heart sounds. No murmur heard.   Pulmonary:     Effort: Pulmonary effort is normal.  No respiratory distress.     Breath sounds: Normal breath sounds. No wheezing.  Abdominal:     General: Bowel sounds are normal. There is no distension.     Palpations: Abdomen is soft.     Tenderness: There is no abdominal tenderness.  Musculoskeletal:        General: No tenderness or edema. Normal range of motion.     Cervical back: Normal range of motion and neck supple.  Skin:    General: Skin is warm and dry.     Findings: Ecchymosis present.          Comments: Grey circular  lesion apprx 1X0.6 cm on right thigh, ecchymosis on lateral left hip  Neurological:     Mental Status: She is alert and oriented to person, place, and time.     Cranial Nerves: No cranial nerve deficit.     Deep Tendon Reflexes: Reflexes are normal and symmetric.  Psychiatric:        Mood and Affect: Mood and affect normal.        Behavior: Behavior normal.        Thought Content: Thought content normal.        Judgment: Judgment normal.  BP (!) 163/89   Pulse 79   Temp (!) 95 F (35 C) (Temporal)   Ht 5' (1.524 m)   Wt 157 lb 9.6 oz (71.5 kg)   BMI 30.78 kg/m   Assessment & Plan:  Kristina Whitaker comes in today with chief complaint of Nevus (Changed in color on left leg) and Fall (Last week and hurt left thigh and right pinky finger )   Diagnosis and orders addressed:  1. Changing nevus - Ambulatory referral to Dermatology  2. History of melanoma - Ambulatory referral to Dermatology  3. Fall from bed, initial encounter  4. Left hip pain  5. Primary hypertension Pt refusing lab work at this time because of fear of needles. Discussed we had to have lab before I could prescribe her any other medications. She is trying herbal things at this time.    Referral to derm given her hx. Mole has changed colors, but is still circular shape.  Fall precautions discussed Rest Tylenol as needed for pain Call if symptoms worsen or do not improve   Evelina Dun, FNP

## 2020-08-22 NOTE — Patient Instructions (Signed)
Melanoma Melanoma is a form of skin cancer that begins in the skin cells that produce pigment (melanocytes). Melanoma usually starts as a mole or growth (lesion) on the skin and can spread to other areas of the body. If found early and treated, many cases of melanoma are curable. What are the causes? The exact cause of this condition is unknown. What increases the risk? The following factors may make you more likely to develop this condition:  Spending a lot of time in the sun, under a sunlamp, or in a tanning booth.  Having sunburn that blisters. The more blistering sunburns you have had, the higher your risk for melanoma becomes.  Spending time in parts of the world where sunlight is intense.  Living in a hot, sunny climate.  Having fair skin that does not tan easily.  Having had melanoma before.  Having a family history of melanoma.  Having many skin moles or unusual moles (dysplastic nevi).  Having a weakened immune system. What are the signs or symptoms? Symptoms of this condition include:  ABCDE changes in a mole. ABCDE stands for: ? Asymmetry. This means the mole has an irregular shape. It is not round or oval. ? Border. This means the mole has an irregular or bumpy border. ? Color. This means the mole has multiple colors in it, including brown, black, blue, red, or tan. ? Diameter. This means the mole is more than 0.2 in (6 mm) across. ? Evolving. This refers to any unusual changes or symptoms in the mole, such as pain, itching, stinging, sensitivity, or bleeding.  A new mole or skin lesion. Symptoms of melanoma that may have spread to other areas of the body include:  Swollen lymph nodes.  Shortness of breath.  Bone pain.  Headache.  Seizures.  Visual problems.   How is this diagnosed? This condition is diagnosed by examining a tissue sample from the mole (biopsy). The biopsy will reveal whether melanoma has spread to deeper layers of the skin. You may also  have other tests, including:  Blood tests.  Chest X-rays.  Sentinel node biopsy.  A CT scan.  A bone scan. How is this treated? This condition is treated with surgery to remove the cancer as well as some of the tissue around where the cancer was found. Your lymph nodes may also be removed during the surgery. If melanoma has spread to other areas, such as the lymph nodes, liver, lungs, bone, or brain, you will need additional treatment, which may include:  Chemotherapy. This treatment uses medicine to kill cancer cells.  Radiation therapy. This treatment uses high-energy rays to kill cancer cells.  Targeted therapy. This treatment uses medicines that target specific genetic mutations linked to melanoma.  Biological therapy (immunotherapy). This treatment acts on the immune system to help kill cancer cells. A combination of surgery and other treatments may be recommended. Melanoma can come back after treatment (recur). Follow these instructions at home: Medicines  Take over-the-counter and prescription medicines only as told by your health care provider.  Talk with your health care provider before taking vitamins, supplements, and over-the-counter medicines. Some of these can interfere with chemotherapy. Eating and drinking  Drink enough fluid to keep your urine pale yellow.  Talk to a dietitian about what you should eat and drink during cancer treatment.  If you have side effects that affect eating, it may help to: ? Eat smaller meals and snacks often. ? Drink high-nutrition and high-calorie shakes or supplements. ? Eat  bland and soft foods that are easy to eat. ? Do not eat foods that are hot, spicy, or difficult to swallow. Lifestyle  Stay out of the sun, especially during peak mid-afternoon hours. Take the following precautions every day, even on cloudy days and in the winter, and even if you will be outdoors for only a short period of time. ? Wear long sleeves, a hat,  and sunglasses that block UV light when possible. ? Regularly apply a sunscreen with an SPF of 30 or higher. ? Consider cosmetics that contain an SPF. ? Limit sun exposure in the middle part of the day.  Get regular exercise, such as walking, gentle yoga, or tai chi.  Do not use any products that contain nicotine or tobacco, such as cigarettes and e-cigarettes. If you need help quitting, ask your health care provider.  Consider joining a support group. Ask your health care provider to recommend support groups online or in your community.   General instructions  Learn as much as you can about your condition.  If you lose your hair, wear a wig, hat, or scarf to cover your head.  Meet with a hair and skin care specialist for makeup and skin care tips.  Work with your health care provider to manage side effects of treatment.  Keep all follow-up visits as told by your health care provider. This is important. You will need to have regular visits during and after treatment as a part of your care. Where to find more information  [place]: https://www.cancer.gov  American Cancer Society: http://www.cancer.org Contact a health care provider if:  You notice any new growths or changes in your skin.  You have had melanoma removed and you notice a new growth in the same location.  You have any new pain or new symptoms. Get help right away if:  You have a seizure.  You have chest pain.  You have trouble breathing.  You have a fever.  You have bleeding that does not stop. Summary  Melanoma is a form of skin cancer that begins in the skin cells that produce pigment (melanocytes). Melanoma usually starts as a mole or growth (lesion) on the skin and can spread to other areas of the body.  This condition is diagnosed by examining a tissue sample from a mole (biopsy).  If found early and treated, many cases of melanoma are curable.  A combination of surgery and other  treatments may be recommended.  Keep all follow-up visits as told by your health care provider. This is important. You will need to have regular visits during and after treatment as a part of your care. This information is not intended to replace advice given to you by your health care provider. Make sure you discuss any questions you have with your health care provider. Document Revised: 08/09/2017 Document Reviewed: 08/09/2017 Elsevier Patient Education  2021 [employer].

## 2020-09-05 ENCOUNTER — Other Ambulatory Visit: Payer: Self-pay

## 2020-09-05 ENCOUNTER — Ambulatory Visit (HOSPITAL_COMMUNITY)
Admission: RE | Admit: 2020-09-05 | Discharge: 2020-09-05 | Disposition: A | Discharge status: Home / Self Care | Payer: Medicare HMO | Source: Ambulatory Visit | Attending: Family | Admitting: Family

## 2020-09-05 DIAGNOSIS — Z1231 Encounter for screening mammogram for malignant neoplasm of breast: Secondary | ICD-10-CM | POA: Diagnosis not present

## 2020-09-06 ENCOUNTER — Other Ambulatory Visit (HOSPITAL_COMMUNITY): Payer: Self-pay | Admitting: Family

## 2020-09-06 DIAGNOSIS — R928 Other abnormal and inconclusive findings on diagnostic imaging of breast: Secondary | ICD-10-CM

## 2020-09-16 DIAGNOSIS — Z85820 Personal history of malignant melanoma of skin: Secondary | ICD-10-CM | POA: Diagnosis not present

## 2020-09-16 DIAGNOSIS — L821 Other seborrheic keratosis: Secondary | ICD-10-CM | POA: Diagnosis not present

## 2020-09-16 DIAGNOSIS — Z08 Encounter for follow-up examination after completed treatment for malignant neoplasm: Secondary | ICD-10-CM | POA: Diagnosis not present

## 2020-09-17 ENCOUNTER — Ambulatory Visit (HOSPITAL_COMMUNITY): Payer: Medicare HMO

## 2020-09-24 ENCOUNTER — Other Ambulatory Visit: Payer: Medicare HMO

## 2020-09-24 ENCOUNTER — Other Ambulatory Visit: Payer: Self-pay

## 2020-09-24 ENCOUNTER — Telehealth: Payer: Self-pay

## 2020-09-24 DIAGNOSIS — I10 Essential (primary) hypertension: Secondary | ICD-10-CM | POA: Diagnosis not present

## 2020-09-24 DIAGNOSIS — Y92009 Unspecified place in unspecified non-institutional (private) residence as the place of occurrence of the external cause: Secondary | ICD-10-CM | POA: Diagnosis not present

## 2020-09-24 DIAGNOSIS — W19XXXD Unspecified fall, subsequent encounter: Secondary | ICD-10-CM

## 2020-09-24 DIAGNOSIS — D329 Benign neoplasm of meninges, unspecified: Secondary | ICD-10-CM | POA: Diagnosis not present

## 2020-09-24 DIAGNOSIS — Z1159 Encounter for screening for other viral diseases: Secondary | ICD-10-CM | POA: Diagnosis not present

## 2020-09-24 DIAGNOSIS — E785 Hyperlipidemia, unspecified: Secondary | ICD-10-CM

## 2020-09-24 NOTE — Telephone Encounter (Signed)
Labs are in [place]. Patient can come any time.

## 2020-09-24 NOTE — Telephone Encounter (Signed)
Patient already had drawn

## 2020-09-25 ENCOUNTER — Telehealth: Payer: Self-pay | Admitting: Family

## 2020-09-25 DIAGNOSIS — I10 Essential (primary) hypertension: Secondary | ICD-10-CM

## 2020-09-25 DIAGNOSIS — E785 Hyperlipidemia, unspecified: Secondary | ICD-10-CM

## 2020-09-25 LAB — CBC WITH DIFFERENTIAL/PLATELET
Basophils Absolute: 0.1 10*3/uL (ref 0.0–0.2)
Basos: 1 %
EOS (ABSOLUTE): 0.1 10*3/uL (ref 0.0–0.4)
Eos: 1 %
Hematocrit: 40.1 % (ref 34.0–46.6)
Hemoglobin: 14 g/dL (ref 11.1–15.9)
Immature Grans (Abs): 0 10*3/uL (ref 0.0–0.1)
Immature Granulocytes: 0 %
Lymphocytes Absolute: 2.7 10*3/uL (ref 0.7–3.1)
Lymphs: 40 %
MCH: 29.9 pg (ref 26.6–33.0)
MCHC: 34.9 g/dL (ref 31.5–35.7)
MCV: 86 fL (ref 79–97)
Monocytes Absolute: 0.5 10*3/uL (ref 0.1–0.9)
Monocytes: 7 %
Neutrophils Absolute: 3.3 10*3/uL (ref 1.4–7.0)
Neutrophils: 51 %
Platelets: 215 10*3/uL (ref 150–450)
RBC: 4.68 x10E6/uL (ref 3.77–5.28)
RDW: 11.2 % — ABNORMAL LOW (ref 11.7–15.4)
WBC: 6.6 10*3/uL (ref 3.4–10.8)

## 2020-09-25 LAB — CMP14+EGFR
ALT: 15 IU/L (ref 0–32)
AST: 20 IU/L (ref 0–40)
Albumin/Globulin Ratio: 2.1 (ref 1.2–2.2)
Albumin: 4.6 g/dL (ref 3.8–4.8)
Alkaline Phosphatase: 102 IU/L (ref 44–121)
BUN/Creatinine Ratio: 19 (ref 12–28)
BUN: 23 mg/dL (ref 8–27)
Bilirubin Total: 0.7 mg/dL (ref 0.0–1.2)
CO2: 21 mmol/L (ref 20–29)
Calcium: 9.7 mg/dL (ref 8.7–10.3)
Chloride: 102 mmol/L (ref 96–106)
Creatinine, Ser: 1.18 mg/dL — ABNORMAL HIGH (ref 0.57–1.00)
GFR calc Af Amer: 54 mL/min/{1.73_m2} — ABNORMAL LOW (ref >59)
GFR calc non Af Amer: 47 mL/min/{1.73_m2} — ABNORMAL LOW (ref >59)
Globulin, Total: 2.2 g/dL (ref 1.5–4.5)
Glucose: 142 mg/dL — ABNORMAL HIGH (ref 65–99)
Potassium: 4.3 mmol/L (ref 3.5–5.2)
Sodium: 141 mmol/L (ref 134–144)
Total Protein: 6.8 g/dL (ref 6.0–8.5)

## 2020-09-25 LAB — LIPID PANEL
Chol/HDL Ratio: 2.9 ratio (ref 0.0–4.4)
Cholesterol, Total: 173 mg/dL (ref 100–199)
HDL: 60 mg/dL (ref >39)
LDL Chol Calc (NIH): 89 mg/dL (ref 0–99)
Triglycerides: 139 mg/dL (ref 0–149)
VLDL Cholesterol Cal: 24 mg/dL (ref 5–40)

## 2020-09-25 LAB — TSH: TSH: 1.09 u[IU]/mL (ref 0.450–4.500)

## 2020-09-25 LAB — HEPATITIS C ANTIBODY: Hep C Virus Ab: <0.1 s/co ratio (ref 0.0–0.9)

## 2020-09-25 MED ORDER — AMLODIPINE BESYLATE 5 MG PO TABS: 5.0000 mg | ORAL_TABLET | Freq: Every day | ORAL | 1 refills | Status: DC

## 2020-09-25 MED ORDER — ROSUVASTATIN CALCIUM 10 MG PO TABS: 10.0000 mg | ORAL_TABLET | Freq: Every day | ORAL | 1 refills | Status: DC

## 2020-09-25 MED ORDER — LOSARTAN POTASSIUM 100 MG PO TABS: 100.0000 mg | ORAL_TABLET | Freq: Every day | ORAL | 1 refills | Status: DC

## 2020-09-25 NOTE — Telephone Encounter (Signed)
Meds sent per [facility]

## 2020-09-25 NOTE — Telephone Encounter (Signed)
Pt is calling to have refills called in, stated that she only has two weeks left (Amlodipine, Losartan and Rosuvastatin) when pt was told that she needed an appt she stated that she was told yesterday (2/15) that she did not need an appt to have these refilled. Pt came in on 2/15 for LABS ONLY and the last med refill appt was 06/20/20 (televisit), before making pt appt I wanted to verify that she was not told by provider or nurse that she did not need an appt because when pt was asked, she did not remember who told her that she did not need an appt. Please call pt back at [phone]

## 2020-10-04 ENCOUNTER — Ambulatory Visit (HOSPITAL_COMMUNITY)
Admission: RE | Admit: 2020-10-04 | Discharge: 2020-10-04 | Disposition: A | Discharge status: Home / Self Care | Payer: Medicare HMO | Source: Ambulatory Visit | Attending: Family | Admitting: Family

## 2020-10-04 ENCOUNTER — Other Ambulatory Visit: Payer: Self-pay

## 2020-10-04 ENCOUNTER — Other Ambulatory Visit: Payer: Self-pay | Admitting: Family

## 2020-10-04 DIAGNOSIS — R928 Other abnormal and inconclusive findings on diagnostic imaging of breast: Secondary | ICD-10-CM | POA: Insufficient documentation

## 2020-10-04 DIAGNOSIS — N6011 Diffuse cystic mastopathy of right breast: Secondary | ICD-10-CM | POA: Diagnosis not present

## 2020-10-15 ENCOUNTER — Other Ambulatory Visit (HOSPITAL_COMMUNITY): Payer: Medicare HMO

## 2020-10-15 ENCOUNTER — Encounter (HOSPITAL_COMMUNITY): Payer: Medicare HMO

## 2020-11-05 ENCOUNTER — Ambulatory Visit (INDEPENDENT_AMBULATORY_CARE_PROVIDER_SITE_OTHER): Payer: Medicare HMO

## 2020-11-05 DIAGNOSIS — Z0000 Encounter for general adult medical examination without abnormal findings: Secondary | ICD-10-CM

## 2020-11-05 NOTE — Progress Notes (Signed)
MEDICARE ANNUAL WELLNESS VISIT  11/05/2020  Telephone Visit Disclaimer This Medicare AWV was conducted by telephone due to national recommendations for restrictions regarding the COVID-19 Pandemic (e.g. social distancing).  I verified, using two identifiers, that I am speaking with Kristina Whitaker or their authorized healthcare agent. I discussed the limitations, risks, security, and privacy concerns of performing an evaluation and management service by telephone and the potential availability of an in-person appointment in the future. The patient expressed understanding and agreed to proceed.  Location of Patient: Home Location of Provider (nurse):  [facility]  Subjective:    Kristina Whitaker is a 70 y.o. female patient of Hawks, Kristina Hawthorne, FNP who had a Medicare Annual Wellness Visit today via telephone. Kristina Whitaker is Retired and lives with their spouse. She has one son and two grandchildren. She reports that she is socially active and does interact with friends/family regularly. She is minimally physically active and enjoys planting flowers, working in the yard, having lunch with friends and traveling.  Patient Care Team: Kristina Balloon, FNP as PCP - General (Family Medicine)  Advanced Directives 11/05/2020 11/03/2019  Does Patient Have a Medical Advance Directive? Yes Yes  Type of Advance Directive Living will [place];Living will;Out of facility DNR (pink MOST or yellow form)  Does patient want to make changes to medical advance directive? No - Patient declined [facility] Utilization Over the Past 12 Months: # of hospitalizations or ER visits: 1 # of surgeries: 0  Review of Systems    Patient reports that her overall health is unchanged compared to last year.  History obtained from chart review and the patient  Patient Reported Readings (BP, Pulse, CBG, Weight, etc) none  Pain Assessment Pain : 0-10 Pain Score: 3  Pain Type: Acute pain Pain Location: Finger  (Comment which one) Pain Orientation: Right Pain Descriptors / Indicators: Aching Pain Onset: 1 to 4 weeks ago Pain Frequency: Several days a week   Patient is having pain in right little finger after a fall several weeks ago.  She does not feel that it is fractured, just sore now.  Current Medications & Allergies (verified) Allergies as of 11/05/2020      Reactions   Diethylpropion    Penicillins Hives   Sulfa Drugs Cross Reactors Hives      Medication List       Accurate as of November 05, 2020  8:27 AM. If you have any questions, ask your nurse or doctor.        amLODipine 5 MG tablet Commonly known as: NORVASC Take 1 tablet (5 mg total) by mouth daily.   aspirin 81 MG tablet Take 81 mg by mouth daily.   losartan 100 MG tablet Commonly known as: COZAAR Take 1 tablet (100 mg total) by mouth daily.   rosuvastatin 10 MG tablet Commonly known as: CRESTOR Take 1 tablet (10 mg total) by mouth daily.       History (reviewed): Past Medical History:  Diagnosis Date  . Cancer ([place])   . DVT (deep venous thrombosis) ([place]) 2015  . Fibroids    uterine around age 34  . Hypertension   . Melanoma in situ of back ([place])    1991  . Ovarian tumor 2011   LMP   Past Surgical History:  Procedure Laterality Date  . BACK SURGERY     melanoma exicision from back  . BREAST SURGERY     bilateral fibroadenomas/ cyst  aspiration  . FACIAL COSMETIC SURGERY     2006  . fatty tumor removed from brian stem    . OVARIAN CYST REMOVAL     x2 with rupture x2 in her 30s and 38s  . SKIN SURGERY     between toes   Family History  Problem Relation Age of Onset  . Heart attack Sister   . Stroke Sister   . Heart attack Maternal Aunt   . Heart attack Paternal Uncle   . Stroke Father   . Heart attack Father   . Heart attack Brother    Social History   Socioeconomic History  . Marital status: Married    Spouse name: Kristina Whitaker  . Number of children: 1  . Years of education: 35  . Highest  education level: Not on file  Occupational History  . Occupation: retired    Comment: Chief Financial Officer  Tobacco Use  . Smoking status: Never Smoker  . Smokeless tobacco: Never Used  Vaping Use  . Vaping Use: Not on file  Substance and Sexual Activity  . Alcohol use: Not Currently    Alcohol/week: 1.0 standard drink    Types: 1 Glasses of wine per week  . Drug use: No  . Sexual activity: Yes  Other Topics Concern  . 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 She lives at home with her husband Kristina Whitaker. They have one grown daughter that lives locally. [place] enjoys traveling. She has an outside cat and an inside Careers adviser.    Social Determinants of Health   Financial Resource Strain: Not on file  Food Insecurity: Not on file  Transportation Needs: Not on file  Physical Activity: Not on file  Stress: Not on file  Social Connections: Not on file    Activities of Daily Living In your present state of health, do you have any difficulty performing the following activities: 11/05/2020  Hearing? N  Vision? N  Difficulty concentrating or making decisions? N  Walking or climbing stairs? N  Dressing or bathing? N  Doing errands, shopping? N  Preparing Food and eating ? N  Using the Toilet? N  In the past six months, have you accidently leaked urine? N  Do you have problems with loss of bowel control? N  Managing your Medications? N  Managing your Finances? N  Housekeeping or managing your Housekeeping? N  Some recent data might be hidden    Patient Education/ Literacy How often do you need to have someone help you when you read instructions, pamphlets, or other written materials from your doctor or pharmacy?: 1 - Never What is the last grade level you completed in school?: 4 year college degree  Exercise Current Exercise Habits: Home exercise routine, Type of exercise: walking, Time (Minutes): 30, Frequency (Times/Week): 4, Weekly Exercise (Minutes/Week): 120,  Intensity: Mild, Exercise limited by: None identified  Diet Patient reports consuming 2 meals a day and 1 snack(s) a day Patient reports that her primary diet is: Regular Patient reports that she does have regular access to food.   Depression Screen PHQ 2/9 Scores 08/22/2020 07/11/2020 11/03/2019 10/18/2019 01/17/2019 03/10/2018 02/25/2018  PHQ - 2 Score 0 0 0 1 1 0 0  PHQ- 9 Score - - - 2 - - -     Fall Risk Fall Risk  11/05/2020 08/22/2020 07/11/2020 05/30/2020 11/03/2019  Falls in the past year? 1 1 1 1 1   Number falls in past yr: 1 0 1  1 0  Injury with Fall? 1 1 0 1 0  Comment pain in right 5th digit - - - -  Risk for fall due to : History of fall(s) History of fall(s) Impaired balance/gait History of fall(s) History of fall(s)  Follow up Falls evaluation completed Education provided - Education provided Falls prevention discussed     Objective:  Kristina Whitaker seemed alert and oriented and she participated appropriately during our telephone visit.  Blood Pressure Weight BMI  BP Readings from Last 3 Encounters:  08/22/20 (!) 163/89  07/11/20 108/62  05/30/20 123/64   Wt Readings from Last 3 Encounters:  08/22/20 157 lb 9.6 oz (71.5 kg)  07/11/20 159 lb (72.1 kg)  05/30/20 160 lb 9.6 oz (72.8 kg)   BMI Readings from Last 1 Encounters:  08/22/20 30.78 kg/m    *Unable to obtain current vital signs, weight, and BMI due to telephone visit type  Hearing/Vision  . Shikara did not seem to have difficulty with hearing/understanding during the telephone conversation . Reports that she has had a formal eye exam by an eye care professional within the past year . Reports that she has not had a formal hearing evaluation within the past year *Unable to fully assess hearing and vision during telephone visit type  Cognitive Function: 6CIT Screen 11/05/2020 11/03/2019  What Year? 0 points 0 points  What month? 0 points 0 points  What time? 0 points 0 points  Count back from 20 0 points 2 points   Months in reverse 0 points 2 points  Repeat phrase 0 points 0 points  Total Score 0 4   (Normal:0-7, Significant for Dysfunction: >8)  Normal Cognitive Function Screening: Yes   Immunization & Health Maintenance Record Immunization History  Administered Date(s) Administered  . Hepatitis A 06/07/1998, 03/28/1999  . Hepatitis B 11/15/2003  . Influenza, Quadrivalent, Recombinant, Inj, Pf 05/25/2019  . Janssen (J&J) SARS-COV-2 Vaccination 10/21/2019  . Meningococcal Conjugate 04/27/2003  . Td 11/15/2003  . Typhoid Inactivated 04/27/2003    Health Maintenance  Topic Date Due  . COLONOSCOPY (Pts 45-55yrs Insurance coverage will need to be confirmed)  Never done  . TETANUS/TDAP  11/14/2013  . DEXA SCAN  Never done  . PNA vac Low Risk Adult (1 of 2 - PCV13) Never done  . COVID-19 Vaccine (2 - Booster for [employer] series) 12/16/2019  . INFLUENZA VACCINE  11/07/2020 (Originally 03/10/2020)  . MAMMOGRAM  09/05/2022  . Hepatitis C Screening  Completed  . HPV VACCINES  Aged Out       Assessment  This is a routine wellness examination for AMBRIELLA KITT.  Health Maintenance: Due or Overdue Health Maintenance Due  Topic Date Due  . COLONOSCOPY (Pts 45-34yrs Insurance coverage will need to be confirmed)  Never done  . TETANUS/TDAP  11/14/2013  . DEXA SCAN  Never done  . PNA vac Low Risk Adult (1 of 2 - PCV13) Never done  . COVID-19 Vaccine (2 - Booster for Janssen series) 12/16/2019   Patient reports she has had a colonoscopy and is not due for repeat for two more years.  Kristina Whitaker does not need a referral for Community Assistance: Care Management:   no Social Work:    no Prescription Assistance:  no Nutrition/Diabetes Education:  no   Plan:  Personalized Goals Goals Addressed            This Visit's Progress   . Patient Stated       11/05/2020  AWV Goal: Fall Prevention  . Over the next year, patient will decrease their risk for falls by: o Using assistive devices,  such as a cane or walker, as needed o Identifying fall risks within their home and correcting them by: - Removing throw rugs - Adding handrails to stairs or ramps - Removing clutter and keeping a clear pathway throughout the home - Increasing light, especially at night - Adding shower handles/bars - Raising toilet seat o Identifying potential personal risk factors for falls: - Medication side effects - Incontinence/urgency - Vestibular dysfunction - Hearing loss - Musculoskeletal disorders - Neurological disorders - Orthostatic hypotension        Personalized Health Maintenance & Screening Recommendations  Pneumococcal vaccine  Td vaccine Bone densitometry screening  Lung Cancer Screening Recommended: no (Low Dose CT Chest recommended if Age 65-80 years, 30 pack-year currently smoking OR have quit w/in past 15 years) Hepatitis C Screening recommended: no HIV Screening recommended: no  Advanced Directives: Written information was not prepared per patient's request.  Referrals & Orders No orders of the defined types were placed in this encounter.   Follow-up Plan . Follow-up with Kristina Balloon, FNP as planned . Schedule Dexa scan  I have personally reviewed and noted the following in the patient's chart:   . Medical and social history . Use of alcohol, tobacco or illicit drugs  . Current medications and supplements . Functional ability and status . Nutritional status . Physical activity . Advanced directives . List of other physicians . Hospitalizations, surgeries, and ER visits in previous 12 months . Vitals . Screenings to include cognitive, depression, and falls . Referrals and appointments  In addition, I have reviewed and discussed with Kristina Whitaker certain preventive protocols, quality metrics, and best practice recommendations. A written personalized care plan for preventive services as well as general preventive health recommendations is available and  can be mailed to the patient at her request.      Felicity Coyer, LPN    [id number]  After visit summary declined

## 2020-11-17 DIAGNOSIS — W57XXXA Bitten or stung by nonvenomous insect and other nonvenomous arthropods, initial encounter: Secondary | ICD-10-CM | POA: Diagnosis not present

## 2020-11-17 DIAGNOSIS — S30861A Insect bite (nonvenomous) of abdominal wall, initial encounter: Secondary | ICD-10-CM | POA: Diagnosis not present

## 2020-11-24 ENCOUNTER — Other Ambulatory Visit: Payer: Self-pay | Admitting: Family

## 2020-11-24 DIAGNOSIS — I10 Essential (primary) hypertension: Secondary | ICD-10-CM

## 2020-12-10 ENCOUNTER — Other Ambulatory Visit: Payer: Self-pay

## 2020-12-10 ENCOUNTER — Ambulatory Visit (INDEPENDENT_AMBULATORY_CARE_PROVIDER_SITE_OTHER): Payer: Medicare HMO

## 2020-12-10 ENCOUNTER — Other Ambulatory Visit (HOSPITAL_COMMUNITY)
Admission: RE | Admit: 2020-12-10 | Discharge: 2020-12-10 | Disposition: A | Discharge status: Home / Self Care | Payer: Medicare HMO | Source: Ambulatory Visit | Attending: Family | Admitting: Family

## 2020-12-10 ENCOUNTER — Ambulatory Visit (INDEPENDENT_AMBULATORY_CARE_PROVIDER_SITE_OTHER): Payer: Medicare HMO | Admitting: Family

## 2020-12-10 ENCOUNTER — Encounter: Payer: Self-pay | Admitting: Family

## 2020-12-10 VITALS — BP 107/50 | HR 80 | Temp 98.4°F | Ht 60.0 in | Wt 155.4 lb

## 2020-12-10 DIAGNOSIS — Z780 Asymptomatic menopausal state: Secondary | ICD-10-CM

## 2020-12-10 DIAGNOSIS — Z01411 Encounter for gynecological examination (general) (routine) with abnormal findings: Secondary | ICD-10-CM

## 2020-12-10 DIAGNOSIS — Z0000 Encounter for general adult medical examination without abnormal findings: Secondary | ICD-10-CM | POA: Insufficient documentation

## 2020-12-10 DIAGNOSIS — L989 Disorder of the skin and subcutaneous tissue, unspecified: Secondary | ICD-10-CM

## 2020-12-10 DIAGNOSIS — I10 Essential (primary) hypertension: Secondary | ICD-10-CM

## 2020-12-10 DIAGNOSIS — E785 Hyperlipidemia, unspecified: Secondary | ICD-10-CM

## 2020-12-10 DIAGNOSIS — Z0001 Encounter for general adult medical examination with abnormal findings: Secondary | ICD-10-CM

## 2020-12-10 DIAGNOSIS — M8588 Other specified disorders of bone density and structure, other site: Secondary | ICD-10-CM | POA: Diagnosis not present

## 2020-12-10 DIAGNOSIS — Z01419 Encounter for gynecological examination (general) (routine) without abnormal findings: Secondary | ICD-10-CM

## 2020-12-10 DIAGNOSIS — N750 Cyst of Bartholin's gland: Secondary | ICD-10-CM

## 2020-12-10 DIAGNOSIS — Z85820 Personal history of malignant melanoma of skin: Secondary | ICD-10-CM

## 2020-12-10 DIAGNOSIS — M810 Age-related osteoporosis without current pathological fracture: Secondary | ICD-10-CM | POA: Diagnosis not present

## 2020-12-10 MED ORDER — ROSUVASTATIN CALCIUM 10 MG PO TABS: 10.0000 mg | ORAL_TABLET | Freq: Every day | ORAL | 1 refills | Status: DC

## 2020-12-10 MED ORDER — AMLODIPINE BESYLATE 5 MG PO TABS
5.0000 mg | ORAL_TABLET | Freq: Every day | ORAL | 1 refills | Status: DC
Start: 2020-12-10 — End: 2021-06-02

## 2020-12-10 MED ORDER — LOSARTAN POTASSIUM 100 MG PO TABS
100.0000 mg | ORAL_TABLET | Freq: Every day | ORAL | 1 refills | Status: DC
Start: 2020-12-10 — End: 2021-06-02

## 2020-12-10 NOTE — Progress Notes (Signed)
Subjective:    Patient ID: Kristina Whitaker, female    DOB: [birth year], 70 y.o.   MRN: [id number]  Chief Complaint  Patient presents with  . Annual Exam    Pap, skin tag on arm and neck. Can see vaginal bumps    Pt presents to the office today for CPE with pap.  She is followed by Neurosurgeon once a year for meningioma.   She is also complaining of skin lesion on her neck and left forearm that has been there for over a year.  Hypertension This is a chronic problem. The current episode started more than 1 year ago. The problem has been resolved since onset. The problem is controlled. Pertinent negatives include no malaise/fatigue, peripheral edema or shortness of breath. Risk factors for coronary artery disease include dyslipidemia and sedentary lifestyle. The current treatment provides moderate improvement.  Hyperlipidemia This is a chronic problem. The current episode started more than 1 year ago. The problem is controlled. Recent lipid tests were reviewed and are normal. Pertinent negatives include no shortness of breath. Current antihyperlipidemic treatment includes statins. The current treatment provides moderate improvement of lipids. Risk factors for coronary artery disease include dyslipidemia, hypertension and a sedentary lifestyle.  Gynecologic Exam The patient's primary symptoms include genital lesions. The current episode started more than 1 month ago. The problem occurs constantly. The patient is experiencing no pain.      Review of Systems  Constitutional: Negative for malaise/fatigue.  Respiratory: Negative for shortness of breath.   All other systems reviewed and are negative.  Family History  Problem Relation Age of Onset  . Heart attack Sister   . Stroke Sister   . Heart attack Maternal Aunt   . Heart attack Paternal Uncle   . Stroke Father   . Heart attack Father   . Heart attack Brother    Social History   Socioeconomic History  . Marital status: Married     Spouse name: Kristina Whitaker  . Number of children: 1  . Years of education: 56  . Highest education level: Not on file  Occupational History  . Occupation: retired    Comment: Chief Financial Officer  Tobacco Use  . Smoking status: Never Smoker  . Smokeless tobacco: Never Used  Vaping Use  . Vaping Use: Not on file  Substance and Sexual Activity  . Alcohol use: Not Currently    Alcohol/week: 1.0 standard drink    Types: 1 Glasses of wine per week  . Drug use: No  . Sexual activity: Yes  Other Topics Concern  . 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 She lives at home with her husband Kristina Whitaker. They have one grown daughter that lives locally. Kristina Whitaker enjoys traveling. She has an outside cat and an inside Careers adviser.    Social Determinants of Health   Financial Resource Strain: Not on file  Food Insecurity: Not on file  Transportation Needs: Not on file  Physical Activity: Not on file  Stress: Not on file  Social Connections: Not on file       Objective:   Physical Exam Vitals reviewed.  Constitutional:      General: She is not in acute distress.    Appearance: She is well-developed.  HENT:     Head: Normocephalic and atraumatic.     Right Ear: Tympanic membrane normal.     Left Ear: Tympanic membrane normal.  Eyes:     Pupils: Pupils are equal, round,  and reactive to light.  Neck:     Thyroid: No thyromegaly.  Cardiovascular:     Rate and Rhythm: Normal rate and regular rhythm.     Heart sounds: Normal heart sounds. No murmur heard.   Pulmonary:     Effort: Pulmonary effort is normal. No respiratory distress.     Breath sounds: Normal breath sounds. No wheezing.  Abdominal:     General: Bowel sounds are normal. There is no distension.     Palpations: Abdomen is soft.     Tenderness: There is no abdominal tenderness.  Musculoskeletal:        General: No tenderness. Normal range of motion.     Cervical back: Normal range of motion and neck supple.  Skin:     General: Skin is warm and dry.          Comments: Skin lesion on right neck and right forearm   Neurological:     Mental Status: She is alert and oriented to person, place, and time.     Cranial Nerves: No cranial nerve deficit.     Deep Tendon Reflexes: Reflexes are normal and symmetric.  Psychiatric:        Behavior: Behavior normal.        Thought Content: Thought content normal.        Judgment: Judgment normal.          BP (!) 107/50   Pulse 80   Temp 98.4 F (36.9 C) (Temporal)   Ht 5' (1.524 m)   Wt 155 lb 6.4 oz (70.5 kg)   BMI 30.35 kg/m   Assessment & Plan:  Kristina Whitaker comes in today with chief complaint of Annual Exam (Pap, skin tag on arm and neck. Can see vaginal bumps )   Diagnosis and orders addressed:  1. Hyperlipidemia, unspecified hyperlipidemia type - rosuvastatin (CRESTOR) 10 MG tablet; Take 1 tablet (10 mg total) by mouth daily.  Dispense: 90 tablet; Refill: 1  2. Primary hypertension - losartan (COZAAR) 100 MG tablet; Take 1 tablet (100 mg total) by mouth daily.  Dispense: 90 tablet; Refill: 1 - amLODipine (NORVASC) 5 MG tablet; Take 1 tablet (5 mg total) by mouth daily.  Dispense: 90 tablet; Refill: 1  3. Annual physical exam  4. History of melanoma  5. Bartholin cyst  6. Skin lesion   Labs refused  Health Maintenance reviewed Diet and exercise encouraged  Follow up plan: 6 months   Evelina Dun, FNP

## 2020-12-10 NOTE — Patient Instructions (Signed)
Bartholin's Cyst  A Bartholin's cyst is a fluid-filled sac that forms as a result of a blockage along the tube (duct) of the Bartholin's gland. Bartholin's glands are small glands in the folds of skin around the vaginal opening (labia). These glands produce fluid to moisten or lubricate the outside of the vagina during sex. A cyst that is not large or infected may not cause any problems or require treatment. If the cyst gets infected with bacteria, it is called a Bartholin's abscess. An abscess may cause symptoms such as pain and swelling and is more likely to require treatment. What are the causes? This condition may be caused by a blocked Bartholin's gland duct. These ducts can become blocked due to natural buildup of fluid and oils. Bacteria inside of the cyst can cause infection. In many cases, the cause is not known. What are the signs or symptoms? Symptoms may include:  A bulge or lump on the labia, near the lower opening of the vagina.  Discomfort or pain. This may get worse during sex or when walking.  Redness, swelling, or fluid draining from the area. These may be signs of an abscess. How severe your symptoms are depends on the size of your cyst and whether it is infected. Infection causes symptoms to get more severe. How is this diagnosed? This condition may be diagnosed based on:  Your symptoms and medical history.  A physical exam to check for swelling in your vaginal area. You may lie on your back on an exam table and have your feet placed into footrests for the exam.  Blood tests to check for infections.  Removal of a fluid sample from the cyst or abscess (biopsy) for testing. You may work with a health care provider who specializes in women's health (gynecologist) for diagnosis and treatment. How is this treated? If your cyst is small, not infected, and not causing symptoms, you may not need treatment. These cysts often go away on their own, with home care such as hot  baths or warm compresses. If you have a large cyst or an abscess, treatment may include:  Antibiotic medicine.  A procedure to drain the fluid inside the cyst or abscess. These procedures involve making an incision in the cyst or abscess so that the fluid drains out, and then one of the following may be done: ? A small, thin tube (catheter) may be placed inside the cyst or abscess so that it does not close and fill up with fluid again (fistulization). The catheter will be removed at a follow-up visit. ? The edges of the incision may be stitched to your skin so that the cyst or abscess stays open (marsupialization). This allows it to continue to drain and not fill up with fluid again. If you have cysts or abscesses that keep returning (recurring) and have required incision and drainage multiple times, your health care provider may talk with you about surgery to remove the Bartholin's gland. Follow these instructions at home: Medicines  Take over-the-counter and prescription medicines only as told by your health care provider.  If you were prescribed an antibiotic medicine, take it as told by your health care provider. Do not stop taking the antibiotic even if your condition improves. Managing pain and swelling  Try sitz baths to help with pain and swelling. A sitz bath is a warm water bath in which the water only comes up to your hips and should cover your buttocks. You may take sitz baths several times a   day.  Apply heat to the affected area as often as needed. Use the heat source that your health care provider recommends, such as a moist heat pack or a heating pad. ? Place a towel between your skin and the heat source. ? Leave the heat on for 20-30 minutes. ? Remove the heat if your skin turns bright red. This is especially important if you are unable to feel pain, heat, or cold. You may have a greater risk of getting burned. Do not fall asleep with the heating pad in place. General  instructions  If your cyst or abscess was drained, follow instructions from your health care provider about how to take care of your wound. Use feminine pads as needed to absorb any drainage.  Do not push on or squeeze your cyst.  Do not have sex until the cyst has gone away or your wound from drainage has healed.  Take these steps to help prevent a Bartholin's cyst from returning and to prevent other Bartholin's cysts from developing: ? Take a bath or shower once a day. Clean your vaginal area with mild soap and water when you bathe. ? Practice safe sex to prevent STIs. Talk with your health care provider about how to prevent STIs and which forms of birth control (contraception) may be best for you.  Keep all follow-up visits. This is important. Contact a health care provider if:  You have a fever.  You develop increasing redness, swelling, or pain around your cyst.  You have fluid, blood, pus, or a bad smell coming from your cyst.  You have a cyst that gets larger or comes back. Summary  A Bartholin's cyst is a fluid-filled sac that forms as a result of a blockage along the duct of the Bartholin's gland.  If your cyst is small, not infected, and not causing symptoms, you may not need any treatment.  If you have a large cyst or an abscess, your health care provider may perform a procedure to drain the fluid.  If you have cysts or abscesses that keep returning (recurring) and have required incision and drainage multiple times, your health care provider may talk with you about surgery to remove the Bartholin's gland. This information is not intended to replace advice given to you by your health care provider. Make sure you discuss any questions you have with your health care provider. Document Revised: 12/25/2019 Document Reviewed: 12/25/2019 Elsevier Patient Education  2021 Elsevier Inc.  

## 2020-12-10 NOTE — Addendum Note (Signed)
Addended by: Evelina Dun A on: 12/10/2020 03:18 PM   Modules accepted: Orders

## 2020-12-11 ENCOUNTER — Other Ambulatory Visit: Payer: Self-pay | Admitting: Family

## 2020-12-11 DIAGNOSIS — M810 Age-related osteoporosis without current pathological fracture: Secondary | ICD-10-CM | POA: Insufficient documentation

## 2020-12-11 MED ORDER — ALENDRONATE SODIUM 70 MG PO TABS: 70.0000 mg | ORAL_TABLET | ORAL | 1 refills | Status: DC

## 2020-12-12 LAB — CYTOLOGY - PAP: Diagnosis: NEGATIVE

## 2020-12-13 ENCOUNTER — Encounter: Payer: Self-pay | Admitting: Family Medicine

## 2020-12-25 DIAGNOSIS — W57XXXA Bitten or stung by nonvenomous insect and other nonvenomous arthropods, initial encounter: Secondary | ICD-10-CM | POA: Diagnosis not present

## 2020-12-25 DIAGNOSIS — S30861A Insect bite (nonvenomous) of abdominal wall, initial encounter: Secondary | ICD-10-CM | POA: Diagnosis not present

## 2021-03-17 ENCOUNTER — Other Ambulatory Visit: Payer: Self-pay | Admitting: Family

## 2021-03-17 DIAGNOSIS — I10 Essential (primary) hypertension: Secondary | ICD-10-CM

## 2021-03-18 ENCOUNTER — Other Ambulatory Visit: Payer: Self-pay | Admitting: Family

## 2021-03-18 DIAGNOSIS — I10 Essential (primary) hypertension: Secondary | ICD-10-CM

## 2021-03-28 ENCOUNTER — Encounter: Payer: Self-pay | Admitting: Family Medicine

## 2021-03-28 ENCOUNTER — Ambulatory Visit (INDEPENDENT_AMBULATORY_CARE_PROVIDER_SITE_OTHER): Payer: Medicare HMO | Admitting: Family Medicine

## 2021-03-28 ENCOUNTER — Other Ambulatory Visit: Payer: Self-pay

## 2021-03-28 VITALS — BP 112/54 | HR 77 | Temp 97.8°F | Ht 60.0 in | Wt 154.4 lb

## 2021-03-28 DIAGNOSIS — H9211 Otorrhea, right ear: Secondary | ICD-10-CM

## 2021-03-28 NOTE — Progress Notes (Signed)
Acute Office Visit  Subjective:    Patient ID: Kristina Whitaker, female    DOB: 1951/05/30, 70 y.o.   MRN: [id number]  Chief Complaint  Patient presents with   Ear Problem    HPI Patient is in today for right ear drainage. She saw something white in her ear 1 week ago. She used a cotton swab to clean out her ear. She hasn't noticed drainage since. She denies pain, congestion, or fever.   Past Medical History:  Diagnosis Date   Cancer ([place])    DVT (deep venous thrombosis) ([place]) 2015   Fibroids    uterine around age 19   Hypertension    Melanoma in situ of back ([place])    1991   Ovarian tumor 2011   LMP    Past Surgical History:  Procedure Laterality Date   BACK SURGERY     melanoma exicision from back   BREAST SURGERY     bilateral fibroadenomas/ cyst aspiration   FACIAL COSMETIC SURGERY     2006   fatty tumor removed from brian stem     OVARIAN CYST REMOVAL     x2 with rupture x2 in her 5s and 41s   SKIN SURGERY     between toes    Family History  Problem Relation Age of Onset   Heart attack Sister    Stroke Sister    Heart attack Maternal Aunt    Heart attack Paternal Uncle    Stroke Father    Heart attack Father    Heart attack Brother     Social History   Socioeconomic History   Marital status: Married    Spouse name: [name]   Number of children: 1   Years of education: 16   Highest education level: Not on file  Occupational History   Occupation: retired    Comment: Chief Financial Officer  Tobacco Use   Smoking status: Never   Smokeless tobacco: Never  Scientific laboratory technician Use: Not on file  Substance and Sexual Activity   Alcohol use: Not Currently    Alcohol/week: 1.0 standard drink    Types: 1 Glasses of wine per week   Drug use: No   Sexual activity: Yes  Other Topics Concern   Not on file  Social History Narrative   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 She lives at home with her husband [name]. They have one grown daughter that lives locally. Deyanara enjoys traveling.  She has an outside cat and an inside Careers adviser.    Social Determinants of Health   Financial Resource Strain: Not on file  Food Insecurity: Not on file  Transportation Needs: Not on file  Physical Activity: Not on file  Stress: Not on file  Social Connections: Not on file  Intimate Partner Violence: Not on file    Outpatient Medications Prior to Visit  Medication Sig Dispense Refill   amLODipine (NORVASC) 5 MG tablet Take 1 tablet (5 mg total) by mouth daily. 90 tablet 1   aspirin 81 MG tablet Take 81 mg by mouth daily.     losartan (COZAAR) 100 MG tablet Take 1 tablet (100 mg total) by mouth daily. 90 tablet 1   rosuvastatin (CRESTOR) 10 MG tablet Take 1 tablet (10 mg total) by mouth daily. 90 tablet 1   alendronate (FOSAMAX) 70 MG tablet Take 1 tablet (70 mg total) by mouth every 7 (seven) days. Take with a full glass of water on an empty stomach. (Patient not  taking: Reported on 03/28/2021) 12 tablet 1   No facility-administered medications prior to visit.    Allergies  Allergen Reactions   Diethylpropion    Penicillins Hives   Sulfa Drugs Cross Reactors Hives    Review of Systems As per HPI.     Objective:    Physical Exam Vitals and nursing note reviewed.  Constitutional:      General: She is not in acute distress.    Appearance: She is not ill-appearing, toxic-appearing or diaphoretic.  HENT:     Right Ear: Tympanic membrane, ear canal and external ear normal. No drainage. There is no impacted cerumen. No foreign body. No mastoid tenderness.     Left Ear: Tympanic membrane, ear canal and external ear normal. No drainage. There is no impacted cerumen. No foreign body. No mastoid tenderness.  Pulmonary:     Effort: Pulmonary effort is normal. No respiratory distress.  Skin:    General: Skin is warm and dry.  Neurological:     Mental Status: She is alert and oriented to person, place, and time.    BP (!) 112/54   Pulse 77   Temp 97.8 F (36.6 C) (Temporal)    Ht 5' (1.524 m)   Wt 154 lb 6 oz (70 kg)   BMI 30.15 kg/m  Wt Readings from Last 3 Encounters:  03/28/21 154 lb 6 oz (70 kg)  12/10/20 155 lb 6.4 oz (70.5 kg)  08/22/20 157 lb 9.6 oz (71.5 kg)    Health Maintenance Due  Topic Date Due   Zoster Vaccines- Shingrix (1 of 2) Never done   COVID-19 Vaccine (2 - Janssen risk series) 11/18/2019   INFLUENZA VACCINE  03/10/2021    There are no preventive care reminders to display for this patient.   Lab Results  Component Value Date   TSH 1.090 09/24/2020   Lab Results  Component Value Date   WBC 6.6 09/24/2020   HGB 14.0 09/24/2020   HCT 40.1 09/24/2020   MCV 86 09/24/2020   PLT 215 09/24/2020   Lab Results  Component Value Date   NA 141 09/24/2020   K 4.3 09/24/2020   CO2 21 09/24/2020   GLUCOSE 142 (H) 09/24/2020   BUN 23 09/24/2020   CREATININE 1.18 (H) 09/24/2020   BILITOT 0.7 09/24/2020   ALKPHOS 102 09/24/2020   AST 20 09/24/2020   ALT 15 09/24/2020   PROT 6.8 09/24/2020   ALBUMIN 4.6 09/24/2020   CALCIUM 9.7 09/24/2020   Lab Results  Component Value Date   CHOL 173 09/24/2020   Lab Results  Component Value Date   HDL 60 09/24/2020   Lab Results  Component Value Date   LDLCALC 89 09/24/2020   Lab Results  Component Value Date   TRIG 139 09/24/2020   Lab Results  Component Value Date   CHOLHDL 2.9 09/24/2020   No results found for: HGBA1C     Assessment & Plan:   Severa was seen today for ear problem.  Diagnoses and all orders for this visit:  Ear drainage right Reports drainage 1 week ago, has since resolved. Normal exam today.    Return to office for new or worsening symptoms, or if symptoms persist.   The patient indicates understanding of these issues and agrees with the plan.  Gwenlyn Perking, FNP

## 2021-06-01 ENCOUNTER — Other Ambulatory Visit: Payer: Self-pay | Admitting: Family

## 2021-06-01 DIAGNOSIS — I10 Essential (primary) hypertension: Secondary | ICD-10-CM

## 2021-06-03 ENCOUNTER — Other Ambulatory Visit: Payer: Self-pay | Admitting: Family

## 2021-06-03 DIAGNOSIS — I10 Essential (primary) hypertension: Secondary | ICD-10-CM

## 2021-06-10 ENCOUNTER — Other Ambulatory Visit: Payer: Self-pay

## 2021-06-10 ENCOUNTER — Ambulatory Visit (INDEPENDENT_AMBULATORY_CARE_PROVIDER_SITE_OTHER): Payer: Medicare HMO | Admitting: Family

## 2021-06-10 ENCOUNTER — Encounter: Payer: Self-pay | Admitting: Family

## 2021-06-10 VITALS — BP 169/65 | HR 67 | Temp 97.9°F | Ht 60.0 in | Wt 148.6 lb

## 2021-06-10 DIAGNOSIS — R413 Other amnesia: Secondary | ICD-10-CM | POA: Diagnosis not present

## 2021-06-10 DIAGNOSIS — I10 Essential (primary) hypertension: Secondary | ICD-10-CM

## 2021-06-10 DIAGNOSIS — D329 Benign neoplasm of meninges, unspecified: Secondary | ICD-10-CM

## 2021-06-10 DIAGNOSIS — E785 Hyperlipidemia, unspecified: Secondary | ICD-10-CM | POA: Diagnosis not present

## 2021-06-10 MED ORDER — LOSARTAN POTASSIUM 100 MG PO TABS
100.0000 mg | ORAL_TABLET | Freq: Every day | ORAL | 1 refills | Status: DC
Start: 2021-06-10 — End: 2022-03-20

## 2021-06-10 MED ORDER — AMLODIPINE BESYLATE 5 MG PO TABS: 5.0000 mg | ORAL_TABLET | Freq: Every day | ORAL | 1 refills | Status: DC

## 2021-06-10 MED ORDER — ALENDRONATE SODIUM 70 MG PO TABS: ORAL_TABLET | ORAL | 0 refills | Status: DC

## 2021-06-10 MED ORDER — ROSUVASTATIN CALCIUM 10 MG PO TABS: 10.0000 mg | ORAL_TABLET | Freq: Every day | ORAL | 1 refills | Status: DC

## 2021-06-10 MED ORDER — LOSARTAN POTASSIUM 100 MG PO TABS: 100.0000 mg | ORAL_TABLET | Freq: Every day | ORAL | 1 refills | Status: DC

## 2021-06-10 NOTE — Patient Instructions (Signed)

## 2021-06-10 NOTE — Progress Notes (Signed)
Subjective:    Patient ID: Kristina Whitaker, female    DOB: [birth year], 70 y.o.   MRN: [id number]  Chief Complaint  Patient presents with   Medical Management of Chronic Issues    Fall back in spring/summer she did not go to the doctor she hit head and states the skin feels funny on that side of head and she also noticed she drooled.    Pt presents to the office today for chronic follow up.  She is followed by Neurosurgeon once a year for meningioma.  She reports memory changes over the last year. She has had multiple people tell her she is becoming more and more forgetful. She has started OTC medication last week and has not been able to see a difference yet.   Her BP is elevated today, but has not taken any of her medications yet.  Hypertension This is a chronic problem. The current episode started more than 1 year ago. The problem has been waxing and waning since onset. The problem is uncontrolled. Associated symptoms include peripheral edema (slight). Pertinent negatives include no malaise/fatigue. Past treatments include calcium channel blockers and angiotensin blockers. The current treatment provides mild improvement.  Hyperlipidemia This is a chronic problem. The current episode started more than 1 year ago. The problem is controlled. Current antihyperlipidemic treatment includes statins. The current treatment provides moderate improvement of lipids.     Review of Systems  Constitutional:  Negative for malaise/fatigue.  All other systems reviewed and are negative.     Objective:   Physical Exam Vitals reviewed.  Constitutional:      General: She is not in acute distress.    Appearance: She is well-developed.  HENT:     Head: Normocephalic and atraumatic.     Right Ear: Tympanic membrane normal.     Left Ear: Tympanic membrane normal.  Eyes:     Pupils: Pupils are equal, round, and reactive to light.  Neck:     Thyroid: No thyromegaly.  Cardiovascular:     Rate and  Rhythm: Normal rate and regular rhythm.     Heart sounds: Normal heart sounds. No murmur heard. Pulmonary:     Effort: Pulmonary effort is normal. No respiratory distress.     Breath sounds: Normal breath sounds. No wheezing.  Abdominal:     General: Bowel sounds are normal. There is no distension.     Palpations: Abdomen is soft.     Tenderness: There is no abdominal tenderness.  Musculoskeletal:        General: No tenderness. Normal range of motion.     Cervical back: Normal range of motion and neck supple.  Skin:    General: Skin is warm and dry.  Neurological:     Mental Status: She is alert and oriented to person, place, and time.     Cranial Nerves: No cranial nerve deficit.     Deep Tendon Reflexes: Reflexes are normal and symmetric.  Psychiatric:        Behavior: Behavior normal.        Thought Content: Thought content normal.        Judgment: Judgment normal.     MMSE - Mini Mental State Exam 06/10/2021  Orientation to time 5  Orientation to Place 5  Registration 3  Attention/ Calculation 5  Recall 0  Language- name 2 objects 2  Language- repeat 1  Language- follow 3 step command 2  Language- read & follow direction 1  Write a sentence  1  Copy design 0  Total score 25      BP (!) 169/65   Pulse 67   Temp 97.9 F (36.6 C) (Temporal)   Ht 5' (1.524 m)   Wt 148 lb 9.6 oz (67.4 kg)   BMI 29.02 kg/m      Assessment & Plan:  Kristina Whitaker comes in today with chief complaint of Medical Management of Chronic Issues (Fall back in spring/summer she did not go to the doctor she hit head and states the skin feels funny on that side of head and she also noticed she drooled. )   Diagnosis and orders addressed:  1. Primary hypertension -PT will take medications and check at home  - amLODipine (NORVASC) 5 MG tablet; Take 1 tablet (5 mg total) by mouth daily.  Dispense: 90 tablet; Refill: 1 - losartan (COZAAR) 100 MG tablet; Take 1 tablet (100 mg total) by mouth  daily.  Dispense: 90 tablet; Refill: 1  2. Hyperlipidemia, unspecified hyperlipidemia type - rosuvastatin (CRESTOR) 10 MG tablet; Take 1 tablet (10 mg total) by mouth daily.  Dispense: 90 tablet; Refill: 1  3. Meningioma ([place])  4. Memory change Pt wants to continue medication she is taking OTC Follow up in 3 months and will start medications.   Labs pending Health Maintenance reviewed Diet and exercise encouraged  Follow up plan: 3 months    Evelina Dun, FNP

## 2021-06-17 DIAGNOSIS — H1045 Other chronic allergic conjunctivitis: Secondary | ICD-10-CM | POA: Diagnosis not present

## 2021-06-17 DIAGNOSIS — H11041 Peripheral pterygium, stationary, right eye: Secondary | ICD-10-CM | POA: Diagnosis not present

## 2021-06-17 DIAGNOSIS — H04123 Dry eye syndrome of bilateral lacrimal glands: Secondary | ICD-10-CM | POA: Diagnosis not present

## 2021-06-17 DIAGNOSIS — H2513 Age-related nuclear cataract, bilateral: Secondary | ICD-10-CM | POA: Diagnosis not present

## 2021-09-02 ENCOUNTER — Other Ambulatory Visit (HOSPITAL_COMMUNITY): Payer: Self-pay | Admitting: Family

## 2021-09-02 DIAGNOSIS — Z1231 Encounter for screening mammogram for malignant neoplasm of breast: Secondary | ICD-10-CM

## 2021-09-11 ENCOUNTER — Ambulatory Visit: Payer: Medicare HMO | Admitting: Family

## 2021-09-11 ENCOUNTER — Encounter: Payer: Self-pay | Admitting: *Deleted

## 2021-09-15 ENCOUNTER — Encounter: Payer: Self-pay | Admitting: Family

## 2021-09-15 ENCOUNTER — Ambulatory Visit (INDEPENDENT_AMBULATORY_CARE_PROVIDER_SITE_OTHER): Payer: Medicare HMO | Admitting: Family

## 2021-09-15 VITALS — BP 130/64 | HR 74 | Temp 98.2°F | Ht 60.0 in | Wt 140.8 lb

## 2021-09-15 DIAGNOSIS — D329 Benign neoplasm of meninges, unspecified: Secondary | ICD-10-CM | POA: Diagnosis not present

## 2021-09-15 DIAGNOSIS — R413 Other amnesia: Secondary | ICD-10-CM

## 2021-09-15 DIAGNOSIS — M810 Age-related osteoporosis without current pathological fracture: Secondary | ICD-10-CM | POA: Diagnosis not present

## 2021-09-15 DIAGNOSIS — Z85820 Personal history of malignant melanoma of skin: Secondary | ICD-10-CM

## 2021-09-15 DIAGNOSIS — I10 Essential (primary) hypertension: Secondary | ICD-10-CM | POA: Diagnosis not present

## 2021-09-15 DIAGNOSIS — E785 Hyperlipidemia, unspecified: Secondary | ICD-10-CM | POA: Diagnosis not present

## 2021-09-15 MED ORDER — ALENDRONATE SODIUM 70 MG PO TABS: ORAL_TABLET | ORAL | 2 refills | Status: DC

## 2021-09-15 MED ORDER — MEMANTINE HCL 5 MG PO TABS
5.0000 mg | ORAL_TABLET | Freq: Two times a day (BID) | ORAL | 1 refills | Status: DC
Start: 2021-09-15 — End: 2021-10-07

## 2021-09-15 MED ORDER — DONEPEZIL HCL 5 MG PO TABS: 5.0000 mg | ORAL_TABLET | Freq: Every day | ORAL | 1 refills | Status: DC

## 2021-09-15 NOTE — Progress Notes (Signed)
Subjective:    Patient ID: Kristina Whitaker, female    DOB: Jun 26, 1951, 71 y.o.   MRN: [id number]  Chief Complaint  Patient presents with   Medical Management of Chronic Issues   Pt presents to the office today for chronic follow up.  She is followed by Neurosurgeon once a year for meningioma.    She reports memory changes over the last two years. She reports she repeats things to her husband multiple times. She is taking OTC medication, but has not noticed a difference.  Hypertension This is a chronic problem. The current episode started more than 1 year ago. The problem has been resolved since onset. The problem is controlled. Pertinent negatives include no malaise/fatigue, peripheral edema or shortness of breath. Risk factors for coronary artery disease include dyslipidemia and sedentary lifestyle. The current treatment provides moderate improvement.  Hyperlipidemia This is a chronic problem. The current episode started more than 1 year ago. The problem is controlled. Recent lipid tests were reviewed and are normal. Pertinent negatives include no shortness of breath. Current antihyperlipidemic treatment includes statins. The current treatment provides moderate improvement of lipids. Risk factors for coronary artery disease include dyslipidemia, hypertension and a sedentary lifestyle.  Osteoporosis Pt takes calcium and vit d when "I think about it". She never started her fosamax. Does not do any scheduled exercises, other than household chores. Last Dexascan 12/10/20.   Review of Systems  Constitutional:  Negative for malaise/fatigue.  Respiratory:  Negative for shortness of breath.   All other systems reviewed and are negative.     Objective:   Physical Exam Vitals reviewed.  Constitutional:      General: She is not in acute distress.    Appearance: She is well-developed.  HENT:     Head: Normocephalic and atraumatic.     Right Ear: Tympanic membrane normal.     Left Ear: Tympanic  membrane normal.  Eyes:     Pupils: Pupils are equal, round, and reactive to light.  Neck:     Thyroid: No thyromegaly.  Cardiovascular:     Rate and Rhythm: Normal rate and regular rhythm.     Heart sounds: Normal heart sounds. No murmur heard. Pulmonary:     Effort: Pulmonary effort is normal. No respiratory distress.     Breath sounds: Normal breath sounds. No wheezing.  Abdominal:     General: Bowel sounds are normal. There is no distension.     Palpations: Abdomen is soft.     Tenderness: There is no abdominal tenderness.  Musculoskeletal:        General: No tenderness. Normal range of motion.     Cervical back: Normal range of motion and neck supple.  Skin:    General: Skin is warm and dry.  Neurological:     Mental Status: She is alert and oriented to person, place, and time.     Cranial Nerves: No cranial nerve deficit.     Deep Tendon Reflexes: Reflexes are normal and symmetric.  Psychiatric:        Behavior: Behavior normal.        Thought Content: Thought content normal.        Judgment: Judgment normal.      BP 130/64    Pulse 74    Temp 98.2 F (36.8 C) (Temporal)    Ht 5' (1.524 m)    Wt 140 lb 12.8 oz (63.9 kg)    BMI 27.50 kg/m      Assessment &  Plan:  Kristina Whitaker comes in today with chief complaint of Medical Management of Chronic Issues   Diagnosis and orders addressed:  1. Primary hypertension - CMP14+EGFR - CBC with Differential/Platelet  2. Meningioma ([place]) - CMP14+EGFR - CBC with Differential/Platelet  3. Hyperlipidemia, unspecified hyperlipidemia type - CMP14+EGFR - CBC with Differential/Platelet - Lipid panel  4. History of melanoma - CMP14+EGFR - CBC with Differential/Platelet  5. Osteoporosis, unspecified osteoporosis type, unspecified pathological fracture presence Start Fosamax  Start Calcium and Vit D  Encourage weight bearing exercises - CMP14+EGFR - CBC with Differential/Platelet - alendronate (FOSAMAX) 70 MG tablet;  TAKE 1 TABLET ON AN EMPTY STOMACH WITH A FULL GLASS OF WATER EVERY 7 DAYS  Dispense: 12 tablet; Refill: 2  6. Memory change Start Aricept and Namenda  RTO in 2 months to recheck  - CMP14+EGFR - CBC with Differential/Platelet - donepezil (ARICEPT) 5 MG tablet; Take 1 tablet (5 mg total) by mouth at bedtime.  Dispense: 90 tablet; Refill: 1 - memantine (NAMENDA) 5 MG tablet; Take 1 tablet (5 mg total) by mouth 2 (two) times daily.  Dispense: 60 tablet; Refill: 1   Pt refuses labs Health Maintenance reviewed Diet and exercise encouraged  Follow up plan: 2 months to follow up for memory changes   Evelina Dun, FNP

## 2021-09-15 NOTE — Patient Instructions (Signed)

## 2021-09-18 ENCOUNTER — Ambulatory Visit (HOSPITAL_COMMUNITY): Payer: Medicare HMO

## 2021-10-07 ENCOUNTER — Other Ambulatory Visit: Payer: Self-pay | Admitting: Family

## 2021-10-07 DIAGNOSIS — R413 Other amnesia: Secondary | ICD-10-CM

## 2021-11-07 ENCOUNTER — Ambulatory Visit (INDEPENDENT_AMBULATORY_CARE_PROVIDER_SITE_OTHER): Payer: Medicare HMO

## 2021-11-07 VITALS — Wt 138.0 lb

## 2021-11-07 DIAGNOSIS — Z0000 Encounter for general adult medical examination without abnormal findings: Secondary | ICD-10-CM

## 2021-11-07 NOTE — Patient Instructions (Signed)
Kristina Whitaker , ?Thank you for taking time to come for your Medicare Wellness Visit. I appreciate your ongoing commitment to your health goals. Please review the following plan we discussed and let me know if I can assist you in the future.  ? ?Screening recommendations/referrals: ?Colonoscopy: Done virtually 12/21/2011 - recommend repeat every 10 years, at least or do at home Cologuard every 3 years *consider this soon please ?Mammogram: Done 09/05/2020 - Repeat annually *this has been ordered - call Forestine Na to make an appointment soon* ?Bone Density: Done 12/10/2020 - Repeat every 2 years ?Recommended yearly ophthalmology/optometry visit for glaucoma screening and checkup ?Recommended yearly dental visit for hygiene and checkup ? ?Vaccinations: ?Influenza vaccine: recommended annually in fall ?Pneumococcal vaccine: recommend once per lifetime [id number]-[id number] ?Tdap vaccine: recommended every 10 years ?Shingles vaccine: recommend Shingrix - 2 doses 2-6 months apart and over 90% effective     ?Covid-19:Janssen done 10/21/2019 - patient declines additional boosters ? ?Advanced directives: Please bring a copy of your health care power of attorney and living will to the office to be added to your chart at your convenience.  ? ?Conditions/risks identified: Aim for 30 minutes of exercise or brisk walking, 6-8 glasses of water, and 5 servings of fruits and vegetables each day. Also, work on memory exercises given by Evelina Dun last month, and make an appointment with Dr Arlan Organ at [facility] as soon as you are able - [phone]. ? ?Next appointment: Follow up in one year for your annual wellness visit  ? ? ?Preventive Care 27 Years and Older, Female ?Preventive care refers to lifestyle choices and visits with your health care provider that can promote health and wellness. ?What does preventive care include? ?A yearly physical exam. This is also called an annual well check. ?Dental exams once or twice a year. ?Routine eye exams.  Ask your health care provider how often you should have your eyes checked. ?Personal lifestyle choices, including: ?Daily care of your teeth and gums. ?Regular physical activity. ?Eating a healthy diet. ?Avoiding tobacco and drug use. ?Limiting alcohol use. ?Practicing safe sex. ?Taking low-dose aspirin every day. ?Taking vitamin and mineral supplements as recommended by your health care provider. ?What happens during an annual well check? ?The services and screenings done by your health care provider during your annual well check will depend on your age, overall health, lifestyle risk factors, and family history of disease. ?Counseling  ?Your health care provider may ask you questions about your: ?Alcohol use. ?Tobacco use. ?Drug use. ?Emotional well-being. ?Home and relationship well-being. ?Sexual activity. ?Eating habits. ?History of falls. ?Memory and ability to understand (cognition). ?Work and work Statistician. ?Reproductive health. ?Screening  ?You may have the following tests or measurements: ?Height, weight, and BMI. ?Blood pressure. ?Lipid and cholesterol levels. These may be checked every 5 years, or more frequently if you are over 42 years old. ?Skin check. ?Lung cancer screening. You may have this screening every year starting at age 8 if you have a 30-pack-year history of smoking and currently smoke or have quit within the past 15 years. ?Fecal occult blood test (FOBT) of the stool. You may have this test every year starting at age 57. ?Flexible sigmoidoscopy or colonoscopy. You may have a sigmoidoscopy every 5 years or a colonoscopy every 10 years starting at age 53. ?Hepatitis C blood test. ?Hepatitis B blood test. ?Sexually transmitted disease (STD) testing. ?Diabetes screening. This is done by checking your blood sugar (glucose) after you have not eaten for a  while (fasting). You may have this done every 1-3 years. ?Bone density scan. This is done to screen for osteoporosis. You may have this  done starting at age 27. ?Mammogram. This may be done every 1-2 years. Talk to your health care provider about how often you should have regular mammograms. ?Talk with your health care provider about your test results, treatment options, and if necessary, the need for more tests. ?Vaccines  ?Your health care provider may recommend certain vaccines, such as: ?Influenza vaccine. This is recommended every year. ?Tetanus, diphtheria, and acellular pertussis (Tdap, Td) vaccine. You may need a Td booster every 10 years. ?Zoster vaccine. You may need this after age 51. ?Pneumococcal 13-valent conjugate (PCV13) vaccine. One dose is recommended after age 43. ?Pneumococcal polysaccharide (PPSV23) vaccine. One dose is recommended after age 91. ?Talk to your health care provider about which screenings and vaccines you need and how often you need them. ?This information is not intended to replace advice given to you by your health care provider. Make sure you discuss any questions you have with your health care provider. ?Document Released: 08/23/2015 Document Revised: 04/15/2016 Document Reviewed: 05/28/2015 ?Elsevier Interactive Patient Education ? 2017 [place]. ? ?Fall Prevention in the Home ?Falls can cause injuries. They can happen to people of all ages. There are many things you can do to make your home safe and to help prevent falls. ?What can I do on the outside of my home? ?Regularly fix the edges of walkways and driveways and fix any cracks. ?Remove anything that might make you trip as you walk through a door, such as a raised step or threshold. ?Trim any bushes or trees on the path to your home. ?Use bright outdoor lighting. ?Clear any walking paths of anything that might make someone trip, such as rocks or tools. ?Regularly check to see if handrails are loose or broken. Make sure that both sides of any steps have handrails. ?Any raised decks and porches should have guardrails on the edges. ?Have any leaves,  snow, or ice cleared regularly. ?Use sand or salt on walking paths during winter. ?Clean up any spills in your garage right away. This includes oil or grease spills. ?What can I do in the bathroom? ?Use night lights. ?Install grab bars by the toilet and in the tub and shower. Do not use towel bars as grab bars. ?Use non-skid mats or decals in the tub or shower. ?If you need to sit down in the shower, use a plastic, non-slip stool. ?Keep the floor dry. Clean up any water that spills on the floor as soon as it happens. ?Remove soap buildup in the tub or shower regularly. ?Attach bath mats securely with double-sided non-slip rug tape. ?Do not have throw rugs and other things on the floor that can make you trip. ?What can I do in the bedroom? ?Use night lights. ?Make sure that you have a light by your bed that is easy to reach. ?Do not use any sheets or blankets that are too big for your bed. They should not hang down onto the floor. ?Have a firm chair that has side arms. You can use this for support while you get dressed. ?Do not have throw rugs and other things on the floor that can make you trip. ?What can I do in the kitchen? ?Clean up any spills right away. ?Avoid walking on wet floors. ?Keep items that you use a lot in easy-to-reach places. ?If you need to reach something above you,  use a strong step stool that has a grab bar. ?Keep electrical cords out of the way. ?Do not use floor polish or wax that makes floors slippery. If you must use wax, use non-skid floor wax. ?Do not have throw rugs and other things on the floor that can make you trip. ?What can I do with my stairs? ?Do not leave any items on the stairs. ?Make sure that there are handrails on both sides of the stairs and use them. Fix handrails that are broken or loose. Make sure that handrails are as long as the stairways. ?Check any carpeting to make sure that it is firmly attached to the stairs. Fix any carpet that is loose or worn. ?Avoid having throw  rugs at the top or bottom of the stairs. If you do have throw rugs, attach them to the floor with carpet tape. ?Make sure that you have a light switch at the top of the stairs and the bottom of the stairs. If

## 2021-11-07 NOTE — Progress Notes (Signed)
? ?Subjective:  ? Kristina Whitaker is a 71 y.o. female who presents for Medicare Annual (Subsequent) preventive examination. ? ?Virtual Visit via Telephone Note ? ?I connected with  Kristina Whitaker on 11/07/21 at  8:15 AM EDT by telephone and verified that I am speaking with the correct person using two identifiers. ? ?Location: ?Patient: Home ?Provider: [facility] ?Persons participating in the virtual visit: patient/Nurse Health Advisor ?  ?I discussed the limitations, risks, security and privacy concerns of performing an evaluation and management service by telephone and the availability of in person appointments. The patient expressed understanding and agreed to proceed. ? ?Interactive audio and video telecommunications were attempted between this nurse and patient, however failed, due to patient having technical difficulties OR patient did not have access to video capability.  We continued and completed visit with audio only. ? ?Some vital signs may be absent or patient reported.  ? ?Kristina Hitch Dionne Ano, LPN  ? ?Review of Systems    ? ?Cardiac Risk Factors include: advanced age (>2mn, >>20women);sedentary lifestyle;hypertension;dyslipidemia;Other (see comment), Risk factor comments: hx of DVT ? ?   ?Objective:  ?  ?Today's Vitals  ? 11/07/21 0824  ?Weight: 138 lb (62.6 kg)  ? ?Body mass index is 26.95 kg/m?. ? ? ?  11/07/2021  ?  8:52 AM 11/05/2020  ?  8:20 AM 11/03/2019  ?  9:45 AM  ?Advanced Directives  ?Does Patient Have a Medical Advance Directive? Yes Yes Yes  ?Type of AParamed[place]of [place] will Living will [place] will;Out of facility DNR (pink MOST or yellow form)  ?Does patient want to make changes to medical advance directive?  No - Patient declined   ?Copy of HCornin Chart? No - copy requested    ? ? ?Current Medications (verified) ?Outpatient Encounter Medications as of 11/07/2021  ?Medication Sig  ? amLODipine (NORVASC) 5 MG tablet Take  1 tablet (5 mg total) by mouth daily.  ? aspirin 81 MG tablet Take 81 mg by mouth daily.  ? losartan (COZAAR) 100 MG tablet Take 1 tablet (100 mg total) by mouth daily.  ? rosuvastatin (CRESTOR) 10 MG tablet Take 1 tablet (10 mg total) by mouth daily.  ? alendronate (FOSAMAX) 70 MG tablet TAKE 1 TABLET ON AN EMPTY STOMACH WITH A FULL GLASS OF WATER EVERY 7 DAYS (Patient not taking: Reported on 11/07/2021)  ? donepezil (ARICEPT) 5 MG tablet Take 1 tablet (5 mg total) by mouth at bedtime. (Patient not taking: Reported on 11/07/2021)  ? memantine (NAMENDA) 5 MG tablet TAKE 1 TABLET BY MOUTH TWICE A DAY (Patient not taking: Reported on 11/07/2021)  ? ?No facility-administered encounter medications on file as of 11/07/2021.  ? ? ?Allergies (verified) ?Diethylpropion, Penicillins, and Sulfa drugs cross reactors  ? ?History: ?Past Medical History:  ?Diagnosis Date  ? Cancer ([facility]   ? [place]  ? Fibroids   ? uterine around age 71 ? Hypertension   ? Melanoma in situ of back ([facility]   ? 1991  ? Ovarian tumor 2011  ? LMP  ? ?Past Surgical History:  ?Procedure Laterality Date  ? BACK SURGERY    ? melanoma exicision from back  ? BREAST SURGERY    ? bilateral fibroadenomas/ cyst aspiration  ? FACIAL COSMETIC SURGERY    ? 2006  ? fatty tumor removed from brian stem    ? OVARIAN CYST REMOVAL    ? x2  with rupture x2 in her 67s and 57s  ? SKIN SURGERY    ? between toes  ? ?Family History  ?Problem Relation Age of Onset  ? Heart attack Sister   ? Stroke Sister   ? Heart attack Maternal Aunt   ? Heart attack Paternal Uncle   ? Stroke Father   ? Heart attack Father   ? Heart attack Brother   ? ?Social History  ? ?Socioeconomic History  ? Marital status: Married  ?  Spouse name: Kristina Whitaker  ? Number of children: 1  ? Years of education: 33  ? Highest education level: Not on file  ?Occupational History  ? Occupation: retired  ?  Comment: Chief Financial Officer  ?Tobacco Use  ? Smoking status: Never  ? Smokeless tobacco: Never   ?Vaping Use  ? Vaping Use: Not on file  ?Substance and Sexual Activity  ? Alcohol use: Not Currently  ?  Alcohol/week: 1.0 standard drink  ?  Types: 1 Glasses of wine per week  ?  Comment: occasional glass of wine  ? Drug use: No  ? Sexual activity: Yes  ?Other Topics Concern  ? Not on file  ?Social History Narrative  ? 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   ? She lives at home with her husband Kristina Whitaker.   ? They have one son that lives [place].   ? Takeria enjoys traveling.   ? She has an outside cat and an inside Careers adviser.   ? ?Social Determinants of Health  ? ?Financial Resource Strain: Low Risk   ? Difficulty of Paying Living Expenses: Not hard at all  ?Food Insecurity: No Food Insecurity  ? Worried About Charity fundraiser in the Last Year: Never true  ? Ran Out of Food in the Last Year: Never true  ?Transportation Needs: No Transportation Needs  ? Lack of Transportation (Medical): No  ? Lack of Transportation (Non-Medical): No  ?Physical Activity: Sufficiently Active  ? Days of Exercise per Week: 5 days  ? Minutes of Exercise per Session: 30 min  ?Stress: No Stress Concern Present  ? Feeling of Stress : Only a little  ?Social Connections: Socially Integrated  ? Frequency of Communication with Friends and Family: More than three times a week  ? Frequency of Social Gatherings with Friends and Family: More than three times a week  ? Attends Religious Services: More than 4 times per year  ? Active Member of Clubs or Organizations: Yes  ? Attends Archivist Meetings: More than 4 times per year  ? Marital Status: Married  ? ? ?Tobacco Counseling ?Counseling given: Not Answered ? ? ?Clinical Intake: ? ?Pre-visit preparation completed: Yes ? ?Pain : No/denies pain ? ?  ? ?BMI - recorded: 26.95 ?Nutritional Status: BMI 25 -29 Overweight ?Nutritional Risks: None ?Diabetes: No ? ?How often do you need to have someone help you when you read instructions, pamphlets, or other written materials from your doctor or pharmacy?:  3 - Sometimes ? ?Diabetic? no ? ?Interpreter Needed?: No ? ?Information entered by :: Maris Bena, LPN ? ? ?Activities of Daily Living ? ?  11/07/2021  ?  8:33 AM  ?In your present state of health, do you have any difficulty performing the following activities:  ?Hearing? 0  ?Vision? 0  ?Difficulty concentrating or making decisions? 1  ?Comment recently prescribed Namenda and Aricept  ?Walking or climbing stairs? 0  ?Dressing or bathing? 0  ?Doing errands, shopping? 0  ?Preparing Food and eating ?  N  ?Using the Toilet? N  ?In the past six months, have you accidently leaked urine? Y  ?Comment wears pads for protection  ?Do you have problems with loss of bowel control? N  ?Managing your Medications? N  ?Managing your Finances? N  ?Housekeeping or managing your Housekeeping? N  ? ? ?Patient Care Team: ?Sharion Balloon, FNP as PCP - General (Family Medicine) ?Loretta Plume (Neurosurgery) ?[facility] (Ophthalmology) ?Allyn Kenner, MD (Dermatology) ? ?Indicate any recent Medical Services you may have received from other than [facility] providers in the past year (date may be approximate). ? ?   ?Assessment:  ? This is a routine wellness examination for [facility]. ? ?Hearing/Vision screen ?Hearing Screening - Comments:: Denies hearing difficulties  ? ?Vision Screening - Comments:: Wears rx glasses - up to date with routine eye exams with [facility] ? ?Dietary issues and exercise activities discussed: ?Current Exercise Habits: Home exercise routine, Type of exercise: walking, Time (Minutes): 30, Frequency (Times/Week): 5, Weekly Exercise (Minutes/Week): 150, Intensity: Mild, Exercise limited by: None identified ? ? Goals Addressed   ? ?  ?  ?  ?  ? This Visit's Progress  ?  Patient Stated   On track  ?  11/07/2021 ?AWV Goal: Exercise for General Health ? ?Patient will verbalize understanding of the benefits of increased physical activity: ?Exercising regularly is important. It will improve your overall fitness,  flexibility, and endurance. ?Regular exercise also will improve your overall health. It can help you control your weight, reduce stress, and improve your bone density. ?Over the next year, patient will increase ph

## 2021-11-12 ENCOUNTER — Encounter: Payer: Self-pay | Admitting: Family

## 2021-11-12 ENCOUNTER — Ambulatory Visit (INDEPENDENT_AMBULATORY_CARE_PROVIDER_SITE_OTHER): Payer: Medicare HMO | Admitting: Family

## 2021-11-12 VITALS — BP 124/68 | HR 76 | Temp 97.5°F | Ht 60.0 in | Wt 138.4 lb

## 2021-11-12 DIAGNOSIS — Y92009 Unspecified place in unspecified non-institutional (private) residence as the place of occurrence of the external cause: Secondary | ICD-10-CM | POA: Diagnosis not present

## 2021-11-12 DIAGNOSIS — F03A0 Unspecified dementia, mild, without behavioral disturbance, psychotic disturbance, mood disturbance, and anxiety: Secondary | ICD-10-CM

## 2021-11-12 DIAGNOSIS — D329 Benign neoplasm of meninges, unspecified: Secondary | ICD-10-CM

## 2021-11-12 DIAGNOSIS — S0990XA Unspecified injury of head, initial encounter: Secondary | ICD-10-CM

## 2021-11-12 DIAGNOSIS — W19XXXA Unspecified fall, initial encounter: Secondary | ICD-10-CM | POA: Diagnosis not present

## 2021-11-12 DIAGNOSIS — R69 Illness, unspecified: Secondary | ICD-10-CM | POA: Diagnosis not present

## 2021-11-12 NOTE — Progress Notes (Signed)
? ?Subjective:  ? ? Patient ID: Kristina Whitaker, female    DOB: February 15, 1951, 71 y.o.   MRN: [id number] ? ?Chief Complaint  ?Patient presents with  ? Fall  ?  Patient rolled out of bed 1 month ago when sleeping and hit head on the right side  ? ?PT presents to the office today for head injury about a month ago. She reports she was reading a book in her bed and fell asleep and rolled off her bed and hit her head. Denies any vision changes, gait changes, or speech. She does have dementia, but this is not new. Reports a intermittent dull headache.  ? ?She states over the last few weeks she became under a great deal of stress. Since then she has "noticed deep wrinkles "on her right forehead. She states these were not there before her fall.  ? ?She has hx of meningioma and is followed by [facility].  She had a MRI brain on 07/03/20 that showed No significant change in the size of the presumed meningioma centered along the right petroclival ligament with extension into the right Meckel's cave and to the right cerebellopontine angle. ?Fall ?The accident occurred More than 1 week ago. She landed on [place]. There was no blood loss. The point of impact was the head. The patient is experiencing no pain. Pertinent negatives include no hearing loss, hematuria or loss of consciousness. She has tried nothing for the symptoms. The treatment provided no relief.  ? ? ? ?Review of Systems  ?Genitourinary:  Negative for hematuria.  ?Neurological:  Negative for loss of consciousness.  ?All other systems reviewed and are negative. ? ?   ?Objective:  ? Physical Exam ?Vitals reviewed.  ?Constitutional:   ?   General: She is not in acute distress. ?   Appearance: She is well-developed.  ?HENT:  ?   Head: Normocephalic and atraumatic.  ?   Right Ear: Tympanic membrane normal.  ?   Left Ear: Tympanic membrane normal.  ?Eyes:  ?   Pupils: Pupils are equal, round, and reactive to light.  ?Neck:  ?   Thyroid: No thyromegaly.  ?Cardiovascular:  ?    Rate and Rhythm: Normal rate and regular rhythm.  ?   Heart sounds: Normal heart sounds. No murmur heard. ?Pulmonary:  ?   Effort: Pulmonary effort is normal. No respiratory distress.  ?   Breath sounds: Normal breath sounds. No wheezing.  ?Abdominal:  ?   General: Bowel sounds are normal. There is no distension.  ?   Palpations: Abdomen is soft.  ?   Tenderness: There is no abdominal tenderness.  ?Musculoskeletal:     ?   General: No tenderness. Normal range of motion.  ?   Cervical back: Normal range of motion and neck supple.  ?Skin: ?   General: Skin is warm and dry.  ?Neurological:  ?   Mental Status: She is alert and oriented to person, place, and time.  ?   Cranial Nerves: No cranial nerve deficit.  ?   Deep Tendon Reflexes: Reflexes are normal and symmetric.  ?Psychiatric:     ?   Mood and Affect: Mood is anxious. Affect is tearful.     ?   Behavior: Behavior normal.     ?   Thought Content: Thought content normal.     ?   Judgment: Judgment normal.  ? ? ? ?BP 124/68   Pulse 76   Temp (!) 97.5 ?F (36.4 ?C) (Temporal)  Ht 5' (1.524 m)   Wt 138 lb 6.4 oz (62.8 kg)   BMI 27.03 kg/m?  ? ? ?   ?Assessment & Plan:  ?Kristina Whitaker comes in today with chief complaint of Fall (Patient rolled out of bed 1 month ago when sleeping and hit head on the right side) ? ? ?Diagnosis and orders addressed: ? ?1. Injury of head, initial encounter ? ?2. Fall in home, initial encounter ? ?3. Meningioma ([place]) ? ?4. Mild dementia without behavioral disturbance, psychotic disturbance, mood disturbance, or anxiety, unspecified dementia type ([place]) ? ? ? ?Normal Neuro exam ?I believe most of her symptoms are from anxiety today. We did discuss it is possible she had a mild concussion.  ?Reassurance given today ?Health Maintenance reviewed ?Diet and exercise encouraged ? ?Follow up plan: ?As needed  ? ?Evelina Dun, FNP ? ? ? ?

## 2021-11-12 NOTE — Patient Instructions (Signed)
Post-Concussion Syndrome ?A concussion is a brain injury from a direct hit to the head or body. This hit causes the brain to shake quickly back and forth inside the skull. This can damage brain cells and cause chemical changes in the brain. Concussions are usually not life-threatening but can cause serious symptoms. ?Post-concussion syndrome is when symptoms that occur after a concussion last longer than normal. These symptoms can last from weeks to months. ?What are the causes? ?The cause of this condition is not known. It can happen whether your head injury was mild or severe. ?What increases the risk? ?You are more likely to develop this condition if: ?You are female. ?You are a child, teen, or young adult. ?You have had a past head injury. ?You have a history of headaches. ?You have depression or anxiety. ?You have loss of consciousness or cannot remember the event (have amnesia of the event). ?You have multiple symptoms or severe symptoms at the time of your concussion. ?What are the signs or symptoms? ?Symptoms of this condition include: ?Physical symptoms. You may have: ?Headaches. ?Tiredness. ?Dizziness and weakness. ?Blurry vision and sensitivity to light. ?Hearing difficulties. ?Problems with balance. ?Mental and emotional symptoms. You may have: ?Memory problems and trouble concentrating. ?Difficulty sleeping or staying asleep. ?Feelings of irritability. ?Anxiety or depression. ?Difficulty learning new things. ?How is this diagnosed? ?This condition may be diagnosed based on: ?Your symptoms. ?A description of your injury. ?Your medical history. ?Testing your strength, balance, and nerve function (neurological examination). ?Your health care provider may order other tests, including brain imaging such as a CT scan or an MRI, and memory testing (neuropsychological testing). ?How is this treated? ?Treatment for this condition may depend on your symptoms. Symptoms usually go away on their own over time.  Treatments may include: ?Medicines for headaches, anxiety, depression, and trouble sleeping (insomnia). ?Resting your brain and body for a few days after your injury. ?Rehabilitation therapy, such as: ?Physical or occupational therapy. This may include exercises to help with balance and dizziness. ?Mental health counseling. A form of talk therapy called cognitive behavioral therapy (CBT) can be especially helpful. This therapy helps you set goals and follow up on the changes that you make. ?Speech therapy. ?Vision therapy. A brain and eye specialist can recommend treatments for vision problems. ?Follow these instructions at home: ?Medicines ?Take over-the-counter and prescription medicines only as told by your health care provider. ?Avoid opioid prescription pain medicines when recovering from a concussion. ?Activity ?Limit your mental activities for the first few days after your injury. This may include not doing the following: ?Homework or job-related work. ?Complex thinking. ?Watching TV, and using a computer or phone. ?Playing memory games and puzzles. ?Gradually return to your normal activity level. If a certain activity brings on your symptoms, stop or slow down until you can do the activity without it triggering your symptoms. ?Limit physical activity, such as exercise or sports, for the first few days after a concussion. Gradually return to normal activity as told by your health care provider. ?Rest. Rest helps your brain heal. Make sure you: ?Get plenty of sleep at night. Most adults should get at least 7-9 hours of sleep each night. ?Rest during the day. Take naps or rest breaks when you feel tired. ?Do not do high-risk activities that could cause a second concussion, such as riding a bike or playing sports. Having another concussion before the first one has healed can be dangerous. ?General instructions ? ?Do not drink alcohol until your health  care provider says that you can. ?Keep track of the frequency  and the severity of your symptoms. Give this information to your health care provider. ?Keep all follow-up visits as directed by your health care provider. This is important. This includes visits with specialists. ?Contact a health care provider if: ?Your symptoms do not improve. ?You have another injury. ?Get help right away if you: ?Have a severe or worsening headache. ?Are confused. ?Have trouble staying awake. ?Faint. ?Vomit. ?Have weakness or numbness in any part of your body. ?Have a seizure. ?Have trouble speaking. ?Summary ?Post-concussion syndrome is when symptoms that occur after a concussion last longer than normal. ?Symptoms usually go away on their own over time. Depending on your symptoms, you may need treatment, such as medicines or rehabilitation therapy. ?Rest your brain and body for a few days after your injury. Gradually return to normal activities as told by your health care provider. ?Get plenty of sleep, and avoid alcohol and opioid pain medicines while recovering from a concussion. ?This information is not intended to replace advice given to you by your health care provider. Make sure you discuss any questions you have with your health care provider. ?Document Revised: 10/10/2020 Document Reviewed: 10/10/2020 ?Elsevier Patient Education ? [place]. ? ?

## 2021-11-24 ENCOUNTER — Ambulatory Visit (HOSPITAL_COMMUNITY)
Admission: RE | Admit: 2021-11-24 | Discharge: 2021-11-24 | Disposition: A | Discharge status: Home / Self Care | Payer: Medicare HMO | Source: Ambulatory Visit | Attending: Family | Admitting: Family

## 2021-11-24 DIAGNOSIS — Z1231 Encounter for screening mammogram for malignant neoplasm of breast: Secondary | ICD-10-CM | POA: Diagnosis not present

## 2022-01-27 ENCOUNTER — Other Ambulatory Visit: Payer: Self-pay | Admitting: *Deleted

## 2022-01-27 DIAGNOSIS — E785 Hyperlipidemia, unspecified: Secondary | ICD-10-CM

## 2022-01-27 MED ORDER — ROSUVASTATIN CALCIUM 10 MG PO TABS: 10.0000 mg | ORAL_TABLET | Freq: Every day | ORAL | 0 refills | Status: DC

## 2022-02-26 ENCOUNTER — Ambulatory Visit (INDEPENDENT_AMBULATORY_CARE_PROVIDER_SITE_OTHER): Payer: Medicare HMO | Admitting: Nurse Practitioner

## 2022-02-26 ENCOUNTER — Encounter: Payer: Self-pay | Admitting: Nurse Practitioner

## 2022-02-26 VITALS — BP 160/69 | HR 76 | Temp 97.6°F | Resp 20 | Ht 60.0 in | Wt 146.0 lb

## 2022-02-26 DIAGNOSIS — L821 Other seborrheic keratosis: Secondary | ICD-10-CM | POA: Diagnosis not present

## 2022-02-26 DIAGNOSIS — C4440 Unspecified malignant neoplasm of skin of scalp and neck: Secondary | ICD-10-CM | POA: Diagnosis not present

## 2022-02-26 MED ORDER — KETOCONAZOLE 2 % EX SHAM: 1.0000 | MEDICATED_SHAMPOO | CUTANEOUS | 0 refills | Status: DC

## 2022-02-26 NOTE — Patient Instructions (Signed)
Seborrheic Keratosis A seborrheic keratosis is a common, noncancerous (benign) skin growth. These growths are velvety, waxy, rough, tan, brown, or black spots that appear on the skin. These skin growths can be flat or raised, and scaly. What are the causes? The cause of this condition is not known. What increases the risk? You are more likely to develop this condition if you: Have a family history of seborrheic keratosis. Are 50 or older. Are pregnant. Have had estrogen replacement therapy. What are the signs or symptoms? Symptoms of this condition include growths on the face, chest, shoulders, back, or other areas. These growths: Are usually painless, but may become irritated and itchy. Can be yellow, brown, black, or other colors. Are slightly raised or have a flat surface. Are sometimes rough or wart-like in texture. Are often velvety or waxy on the surface. Are round or oval-shaped. Often occur in groups, but may occur as a single growth. How is this diagnosed? This condition is diagnosed with a medical history and physical exam. A sample of the growth may be tested (skin biopsy). You may need to see a skin specialist (dermatologist). How is this treated? Treatment is not usually needed for this condition, unless the growths are irritated or bleed often. You may also choose to have the growths removed if you do not like their appearance. Most commonly, these growths are treated with a procedure in which liquid nitrogen is applied to "freeze" off the growth (cryosurgery). They may also be burned off with electricity (electrocautery) or removed by scraping (curettage). Follow these instructions at home: Watch your growth for any changes. Keep all follow-up visits as told by your health care provider. This is important. Do not scratch or pick at the growth or growths. This can cause them to become irritated or infected. Contact a health care provider if: You suddenly have many new  growths. Your growth bleeds, itches, or hurts. Your growth suddenly becomes larger or changes color. Summary A seborrheic keratosis is a common, noncancerous (benign) skin growth. Treatment is not usually needed for this condition, unless the growths are irritated or bleed often. Watch your growth for any changes. Contact a health care provider if you suddenly have many new growths or your growth suddenly becomes larger or changes color. Keep all follow-up visits as told by your health care provider. This is important. This information is not intended to replace advice given to you by your health care provider. Make sure you discuss any questions you have with your health care provider. Document Revised: 05/21/2021 Document Reviewed: 05/21/2021 Elsevier Patient Education  2023 Elsevier Inc.  

## 2022-02-26 NOTE — Progress Notes (Signed)
   Subjective:    Patient ID: Kristina Whitaker, female    DOB: 05-16-1951, 71 y.o.   MRN: [id number]   Chief Complaint: scalp lesions  HPI  Patient comes in today c/o : -  lesions on his scalp. She noticed them over a year ago. They come and go. She has seen C. Hawks for this and was told she has seborrhetic keratosis.  - has had skin cancer removed from saclp and now she is a raised area in  the same place where cancer was remove.  Review of Systems  Constitutional:  Negative for diaphoresis.  Eyes:  Negative for pain.  Respiratory:  Negative for shortness of breath.   Cardiovascular:  Negative for chest pain, palpitations and leg swelling.  Gastrointestinal:  Negative for abdominal pain.  Endocrine: Negative for polydipsia.  Skin:  Negative for rash.  Neurological:  Negative for dizziness, weakness and headaches.  Hematological:  Does not bruise/bleed easily.  All other systems reviewed and are negative.      Objective:   Physical Exam Constitutional:      Appearance: Normal appearance.  Cardiovascular:     Rate and Rhythm: Normal rate and regular rhythm.  Pulmonary:     Effort: Pulmonary effort is normal.     Breath sounds: Normal breath sounds.  Skin:    General: Skin is warm.     Comments: erythematous raised nontender lesion on right posterior scalp Dry flaky patches on scalp.  Neurological:     General: No focal deficit present.     Mental Status: She is alert and oriented to person, place, and time.  Psychiatric:        Mood and Affect: Mood normal.        Behavior: Behavior normal.     BP (!) 160/69   Pulse 76   Temp 97.6 F (36.4 C) (Temporal)   Resp 20   Ht 5' (1.524 m)   Wt 146 lb (66.2 kg)   SpO2 100%   BMI 28.51 kg/m        Assessment & Plan:   Kristina Whitaker in today with chief complaint of Places in scalp   1. Seborrheic keratosis of scalp [place] scalp good when washing hair Meds ordered this encounter  Medications   ketoconazole  (NIZORAL) 2 % shampoo    Sig: Apply 1 Application topically 2 (two) times a week.    Dispense:  120 mL    Refill:  0    Order Specific Question:   Supervising Provider    Answer:   Caryl Pina A [[id number]]    2. Skin cancer of scalp Do not pick or scratch at area  RTO prn    The above assessment and management plan was discussed with the patient. The patient verbalized understanding of and has agreed to the management plan. Patient is aware to call the clinic if symptoms persist or worsen. Patient is aware when to return to the clinic for a follow-up visit. Patient educated on when it is appropriate to go to the emergency department.   Kristina Hassell Done, FNP

## 2022-03-08 ENCOUNTER — Other Ambulatory Visit: Payer: Self-pay | Admitting: Family

## 2022-03-08 DIAGNOSIS — R413 Other amnesia: Secondary | ICD-10-CM

## 2022-03-10 ENCOUNTER — Ambulatory Visit (INDEPENDENT_AMBULATORY_CARE_PROVIDER_SITE_OTHER): Payer: Medicare HMO | Admitting: Family Medicine

## 2022-03-10 ENCOUNTER — Encounter: Payer: Self-pay | Admitting: Family Medicine

## 2022-03-10 VITALS — BP 122/56 | HR 84 | Temp 97.8°F | Ht 61.0 in | Wt 144.4 lb

## 2022-03-10 DIAGNOSIS — L989 Disorder of the skin and subcutaneous tissue, unspecified: Secondary | ICD-10-CM | POA: Diagnosis not present

## 2022-03-10 DIAGNOSIS — L821 Other seborrheic keratosis: Secondary | ICD-10-CM | POA: Diagnosis not present

## 2022-03-10 NOTE — Progress Notes (Signed)
Assessment & Plan:  1. Skin lesion of left leg Suggested referral to dermatology for biopsy and/or removal. - Ambulatory referral to Dermatology  2. Seborrheic keratosis Reassurance provided on skin lesion to RLE.   Follow up plan: Return if symptoms worsen or fail to improve.  Hendricks Limes, MSN, APRN, FNP-C [facility]  Subjective:   Patient ID: Kristina Whitaker, female    DOB: 24-Jul-1951, 71 y.o.   MRN: [id number]  HPI: Kristina Whitaker is a 71 y.o. female presenting on 03/10/2022 for Nevus (Patient states she noticed black moles on her bottom that she would like checked. Does not know how long it has been there. )  Patient reports a black mole her left upper leg and right lower leg.  The right leg lesion has not changed.  The left leg lesion has gotten darker in color and has turned scaly.  She does not feel it has changed in size.  Denies itching, or pain.  It does occasionally bleed.  She is unsure how long either have been present.   ROS: Negative unless specifically indicated above in HPI.   Relevant past medical history reviewed and updated as indicated.   Allergies and medications reviewed and updated.   Current Outpatient Medications:    alendronate (FOSAMAX) 70 MG tablet, TAKE 1 TABLET ON AN EMPTY STOMACH WITH A FULL GLASS OF WATER EVERY 7 DAYS, Disp: 12 tablet, Rfl: 2   amLODipine (NORVASC) 5 MG tablet, Take 1 tablet (5 mg total) by mouth daily., Disp: 90 tablet, Rfl: 1   aspirin 81 MG tablet, Take 81 mg by mouth daily., Disp: , Rfl:    ketoconazole (NIZORAL) 2 % shampoo, Apply 1 Application topically 2 (two) times a week., Disp: 120 mL, Rfl: 0   losartan (COZAAR) 100 MG tablet, Take 1 tablet (100 mg total) by mouth daily., Disp: 90 tablet, Rfl: 1   rosuvastatin (CRESTOR) 10 MG tablet, Take 1 tablet (10 mg total) by mouth daily., Disp: 90 tablet, Rfl: 0   donepezil (ARICEPT) 5 MG tablet, TAKE 1 TABLET BY MOUTH EVERYDAY AT BEDTIME (Patient not taking:  Reported on 03/10/2022), Disp: 90 tablet, Rfl: 0   memantine (NAMENDA) 5 MG tablet, TAKE 1 TABLET BY MOUTH TWICE A DAY (Patient not taking: Reported on 03/10/2022), Disp: 180 tablet, Rfl: 0  Allergies  Allergen Reactions   Diethylpropion    Penicillins Hives   Sulfa Antibiotics    Sulfa Drugs Cross Reactors Hives    Objective:   BP (!) 122/56   Pulse 84   Temp 97.8 F (36.6 C) (Temporal)   Ht '5\' 1"'$  (1.549 m)   Wt 144 lb 6.4 oz (65.5 kg)   SpO2 97%   BMI 27.28 kg/m    Physical Exam Vitals reviewed.  Constitutional:      General: She is not in acute distress.    Appearance: Normal appearance. She is not ill-appearing, toxic-appearing or diaphoretic.  HENT:     Head: Normocephalic and atraumatic.  Eyes:     General: No scleral icterus.       Right eye: No discharge.        Left eye: No discharge.     Conjunctiva/sclera: Conjunctivae normal.  Cardiovascular:     Rate and Rhythm: Normal rate.  Pulmonary:     Effort: Pulmonary effort is normal. No respiratory distress.  Musculoskeletal:        General: Normal range of motion.     Cervical back: Normal range  of motion.  Skin:    General: Skin is warm and dry.     Capillary Refill: Capillary refill takes less than 2 seconds.     Findings: Lesion (RLE - seborrheic keratosis. LLE - dark, scaly, raised, skin lesion with a tiny amount of blood around it.) present.  Neurological:     General: No focal deficit present.     Mental Status: She is alert and oriented to person, place, and time. Mental status is at baseline.  Psychiatric:        Mood and Affect: Mood normal.        Behavior: Behavior normal.        Thought Content: Thought content normal.        Judgment: Judgment normal.

## 2022-03-17 ENCOUNTER — Encounter: Payer: Self-pay | Admitting: Family

## 2022-03-17 ENCOUNTER — Ambulatory Visit (INDEPENDENT_AMBULATORY_CARE_PROVIDER_SITE_OTHER): Payer: Medicare HMO | Admitting: Family

## 2022-03-17 VITALS — BP 116/52 | HR 82 | Temp 97.0°F | Ht 61.0 in | Wt 145.4 lb

## 2022-03-17 DIAGNOSIS — L820 Inflamed seborrheic keratosis: Secondary | ICD-10-CM

## 2022-03-17 DIAGNOSIS — L821 Other seborrheic keratosis: Secondary | ICD-10-CM

## 2022-03-17 NOTE — Patient Instructions (Signed)
Seborrheic Keratosis A seborrheic keratosis is a common, noncancerous (benign) skin growth. These growths are velvety, waxy, or rough spots that appear on the skin. They are often tan, brown, or black. The skin growths can be flat or raised and may be scaly. What are the causes? The cause of this condition is not known. What increases the risk? You are more likely to develop this condition if you: Have a family history of seborrheic keratosis. Are 50 years old or older. Are pregnant. Have had estrogen replacement therapy. What are the signs or symptoms? Symptoms of this condition include growths on the face, chest, shoulders, back, or other areas. These growths: Are usually painless, but may become irritated and itchy. Can be tan, yellow, brown, black, or other colors. Are slightly raised or have a flat surface. Are sometimes rough or wart-like in texture. Are often velvety or waxy on the surface. Are round or oval-shaped. Often occur in groups, but may occur as a single growth. How is this diagnosed? This condition is diagnosed with a medical history and physical exam. A sample of the growth may be tested (skin biopsy). You may also need to see a skin specialist (dermatologist). How is this treated? Treatment is not usually needed for this condition unless the growths are irritated or bleed often. You may also choose to have the growths removed if you do not like their appearance. Growth removal may include a procedure in which: Liquid nitrogen is applied to "freeze" off the growth (cryosurgery). This is the most common procedure. The growth is burned off with electricity (electrocautery). The growth is removed by scraping (curettage). Follow these instructions at home: Watch your growth or growths for any changes. Do not scratch or pick at the growth or growths. This can cause them to become irritated or infected. Contact a health care provider if: You suddenly have many new  growths. Your growth bleeds, itches, or hurts. Your growth suddenly becomes larger or changes color. Summary A seborrheic keratosis is a common, noncancerous skin growth. Treatment is not usually needed for this condition unless the growths are irritated or bleed often. Watch your growth or growths for any changes. Contact a health care provider if you suddenly have many new growths or your growth suddenly becomes larger or changes color. This information is not intended to replace advice given to you by your health care provider. Make sure you discuss any questions you have with your health care provider. Document Revised: 10/10/2021 Document Reviewed: 10/10/2021 Elsevier Patient Education  2023 Elsevier Inc.  

## 2022-03-17 NOTE — Progress Notes (Signed)
   Subjective:    Patient ID: Kristina Whitaker, female    DOB: 08-Nov-1950, 70 y.o.   MRN: [id number]  Chief Complaint  Patient presents with   Skin Problem    HPI PT presents to the office today with two skin lesions that she noticed over a year ago. Reports she shown a doctor in the ED these and told to follow up. She has seen dermatologists in the place. Denies any pain, discharge, or changes in size.    Review of Systems  All other systems reviewed and are negative.      Objective:   Physical Exam Vitals reviewed.  Constitutional:      General: She is not in acute distress.    Appearance: She is well-developed.  HENT:     Head: Normocephalic and atraumatic.  Eyes:     Pupils: Pupils are equal, round, and reactive to light.  Neck:     Thyroid: No thyromegaly.  Cardiovascular:     Rate and Rhythm: Normal rate and regular rhythm.     Heart sounds: Normal heart sounds. No murmur heard. Pulmonary:     Effort: Pulmonary effort is normal. No respiratory distress.     Breath sounds: Normal breath sounds. No wheezing.  Abdominal:     General: Bowel sounds are normal. There is no distension.     Palpations: Abdomen is soft.     Tenderness: There is no abdominal tenderness.  Musculoskeletal:        General: No tenderness. Normal range of motion.     Cervical back: Normal range of motion and neck supple.  Skin:    General: Skin is warm and dry.     Findings: Lesion present.          Comments: Waxy like lesion approx 0.9X0.9 cm on posterior left thigh, and 0.3X08cm  Neurological:     Mental Status: She is alert and oriented to person, place, and time.     Cranial Nerves: No cranial nerve deficit.     Deep Tendon Reflexes: Reflexes are normal and symmetric.  Psychiatric:        Behavior: Behavior normal.        Thought Content: Thought content normal.        Judgment: Judgment normal.    Cryotherapy used on both lesions for 10 seconds. Pt tolerated well.   BP (!) 116/52    Pulse 82   Temp (!) 97 F (36.1 C) (Temporal)   Ht '5\' 1"'$  (1.549 m)   Wt 145 lb 6.4 oz (66 kg)   SpO2 99%   BMI 27.47 kg/m       Assessment & Plan:   1. Seborrheic keratosis May blister, no not pick at  Keep clean and dry Follow up if symptoms worsen or do not improve   Evelina Dun, FNP

## 2022-03-20 ENCOUNTER — Other Ambulatory Visit: Payer: Self-pay | Admitting: Family

## 2022-03-20 DIAGNOSIS — I10 Essential (primary) hypertension: Secondary | ICD-10-CM

## 2022-03-22 ENCOUNTER — Other Ambulatory Visit: Payer: Self-pay | Admitting: Family

## 2022-03-22 DIAGNOSIS — I10 Essential (primary) hypertension: Secondary | ICD-10-CM

## 2022-03-25 ENCOUNTER — Other Ambulatory Visit: Payer: Self-pay | Admitting: Nurse Practitioner

## 2022-03-25 DIAGNOSIS — L821 Other seborrheic keratosis: Secondary | ICD-10-CM

## 2022-03-27 ENCOUNTER — Other Ambulatory Visit: Payer: Self-pay | Admitting: Family

## 2022-03-27 DIAGNOSIS — E785 Hyperlipidemia, unspecified: Secondary | ICD-10-CM

## 2022-05-21 ENCOUNTER — Ambulatory Visit: Payer: Medicare HMO | Admitting: Family Medicine

## 2022-05-22 ENCOUNTER — Ambulatory Visit (INDEPENDENT_AMBULATORY_CARE_PROVIDER_SITE_OTHER): Payer: Medicare HMO | Admitting: Family

## 2022-05-22 ENCOUNTER — Encounter: Payer: Self-pay | Admitting: Family

## 2022-05-22 VITALS — BP 173/73 | HR 71 | Temp 97.4°F | Ht 63.0 in | Wt 148.0 lb

## 2022-05-22 DIAGNOSIS — R519 Headache, unspecified: Secondary | ICD-10-CM

## 2022-05-22 DIAGNOSIS — I10 Essential (primary) hypertension: Secondary | ICD-10-CM

## 2022-05-22 DIAGNOSIS — S90851A Superficial foreign body, right foot, initial encounter: Secondary | ICD-10-CM | POA: Diagnosis not present

## 2022-05-22 MED ORDER — AMLODIPINE BESYLATE 10 MG PO TABS: 10.0000 mg | ORAL_TABLET | Freq: Every day | ORAL | 1 refills | Status: DC

## 2022-05-22 NOTE — Patient Instructions (Signed)
Hypertension, Adult High blood pressure (hypertension) is when the force of blood pumping through the arteries is too strong. The arteries are the blood vessels that carry blood from the heart throughout the body. Hypertension forces the heart to work harder to pump blood and may cause arteries to become narrow or stiff. Untreated or uncontrolled hypertension can lead to a heart attack, heart failure, a stroke, kidney disease, and other problems. A blood pressure reading consists of a higher number over a lower number. Ideally, your blood pressure should be below 120/80. The first ("top") number is called the systolic pressure. It is a measure of the pressure in your arteries as your heart beats. The second ("bottom") number is called the diastolic pressure. It is a measure of the pressure in your arteries as the heart relaxes. What are the causes? The exact cause of this condition is not known. There are some conditions that result in high blood pressure. What increases the risk? Certain factors may make you more likely to develop high blood pressure. Some of these risk factors are under your control, including: Smoking. Not getting enough exercise or physical activity. Being overweight. Having too much fat, sugar, calories, or salt (sodium) in your diet. Drinking too much alcohol. Other risk factors include: Having a personal history of heart disease, diabetes, high cholesterol, or kidney disease. Stress. Having a family history of high blood pressure and high cholesterol. Having obstructive sleep apnea. Age. The risk increases with age. What are the signs or symptoms? High blood pressure may not cause symptoms. Very high blood pressure (hypertensive crisis) may cause: Headache. Fast or irregular heartbeats (palpitations). Shortness of breath. Nosebleed. Nausea and vomiting. Vision changes. Severe chest pain, dizziness, and seizures. How is this diagnosed? This condition is diagnosed by  measuring your blood pressure while you are seated, with your arm resting on a flat surface, your legs uncrossed, and your feet flat on the floor. The cuff of the blood pressure monitor will be placed directly against the skin of your upper arm at the level of your heart. Blood pressure should be measured at least twice using the same arm. Certain conditions can cause a difference in blood pressure between your right and left arms. If you have a high blood pressure reading during one visit or you have normal blood pressure with other risk factors, you may be asked to: Return on a different day to have your blood pressure checked again. Monitor your blood pressure at home for 1 week or longer. If you are diagnosed with hypertension, you may have other blood or imaging tests to help your health care provider understand your overall risk for other conditions. How is this treated? This condition is treated by making healthy lifestyle changes, such as eating healthy foods, exercising more, and reducing your alcohol intake. You may be referred for counseling on a healthy diet and physical activity. Your health care provider may prescribe medicine if lifestyle changes are not enough to get your blood pressure under control and if: Your systolic blood pressure is above 130. Your diastolic blood pressure is above 80. Your personal target blood pressure may vary depending on your medical conditions, your age, and other factors. Follow these instructions at home: Eating and drinking  Eat a diet that is high in fiber and potassium, and low in sodium, added sugar, and fat. An example of this eating plan is called the DASH diet. DASH stands for Dietary Approaches to Stop Hypertension. To eat this way: Eat   plenty of fresh fruits and vegetables. Try to fill one half of your plate at each meal with fruits and vegetables. Eat whole grains, such as whole-wheat pasta, brown rice, or whole-grain bread. Fill about one  fourth of your plate with whole grains. Eat or drink low-fat dairy products, such as skim milk or low-fat yogurt. Avoid fatty cuts of meat, processed or cured meats, and poultry with skin. Fill about one fourth of your plate with lean proteins, such as fish, chicken without skin, beans, eggs, or tofu. Avoid pre-made and processed foods. These tend to be higher in sodium, added sugar, and fat. Reduce your daily sodium intake. Many people with hypertension should eat less than 1,500 mg of sodium a day. Do not drink alcohol if: Your health care provider tells you not to drink. You are pregnant, may be pregnant, or are planning to become pregnant. If you drink alcohol: Limit how much you have to: 0-1 drink a day for women. 0-2 drinks a day for men. Know how much alcohol is in your drink. In the [place] one drink equals one 12 oz bottle of beer (355 mL), one 5 oz glass of wine (148 mL), or one 1 oz glass of hard liquor (44 mL). Lifestyle  Work with your health care provider to maintain a healthy body weight or to lose weight. Ask what an ideal weight is for you. Get at least 30 minutes of exercise that causes your heart to beat faster (aerobic exercise) most days of the week. Activities may include walking, swimming, or biking. Include exercise to strengthen your muscles (resistance exercise), such as Pilates or lifting weights, as part of your weekly exercise routine. Try to do these types of exercises for 30 minutes at least 3 days a week. Do not use any products that contain nicotine or tobacco. These products include cigarettes, chewing tobacco, and vaping devices, such as e-cigarettes. If you need help quitting, ask your health care provider. Monitor your blood pressure at home as told by your health care provider. Keep all follow-up visits. This is important. Medicines Take over-the-counter and prescription medicines only as told by your health care provider. Follow directions carefully. Blood  pressure medicines must be taken as prescribed. Do not skip doses of blood pressure medicine. Doing this puts you at risk for problems and can make the medicine less effective. Ask your health care provider about side effects or reactions to medicines that you should watch for. Contact a health care provider if you: Think you are having a reaction to a medicine you are taking. Have headaches that keep coming back (recurring). Feel dizzy. Have swelling in your ankles. Have trouble with your vision. Get help right away if you: Develop a severe headache or confusion. Have unusual weakness or numbness. Feel faint. Have severe pain in your chest or abdomen. Vomit repeatedly. Have trouble breathing. These symptoms may be an emergency. Get help right away. Call 911. Do not wait to see if the symptoms will go away. Do not drive yourself to the hospital. Summary Hypertension is when the force of blood pumping through your arteries is too strong. If this condition is not controlled, it may put you at risk for serious complications. Your personal target blood pressure may vary depending on your medical conditions, your age, and other factors. For most people, a normal blood pressure is less than 120/80. Hypertension is treated with lifestyle changes, medicines, or a combination of both. Lifestyle changes include losing weight, eating a healthy,   low-sodium diet, exercising more, and limiting alcohol. This information is not intended to replace advice given to you by your health care provider. Make sure you discuss any questions you have with your health care provider. Document Revised: 06/03/2021 Document Reviewed: 06/03/2021 Elsevier Patient Education  2023 [employer]  

## 2022-05-22 NOTE — Progress Notes (Signed)
Subjective:    Patient ID: Kristina Whitaker, female    DOB: 1950/11/10, 71 y.o.   MRN: [id number]  Chief Complaint  Patient presents with   Foreign Body    Stepped on glass 2-3 weeks okay think it might still be in right foot. Patient declines teatnus shot. Patient thinks it might have been a broken glass bottle. Patient was at home.    Headache    Patient thinks medication she is on has been causing headaches started 1-2 months ago. Wants medication checked. Patient states she ate a lot of salt is why BP is up.   PT presents to the office today with complaints of a piece of glass in right ball of her foot. She stepped on some broken glass in the floor about 3 weeks ago. She was not able to remove it all. States the area is swollen now. Denies any pain, fever, or discharge.  Foreign Body The incident occurred more than 1 week ago.  Headache  This is a chronic problem. The current episode started more than 1 year ago. The problem occurs intermittently. The problem has been waxing and waning (one every 2-3 weeks). The pain is located in the Bilateral region. The pain quality is not similar to prior headaches. Her past medical history is significant for hypertension.  Hypertension This is a chronic problem. The current episode started more than 1 year ago. The problem has been waxing and waning since onset. The problem is uncontrolled. Associated symptoms include headaches. Pertinent negatives include no malaise/fatigue, peripheral edema or shortness of breath. Risk factors for coronary artery disease include sedentary lifestyle. Past treatments include calcium channel blockers and angiotensin blockers. The current treatment provides moderate improvement.      Review of Systems  Constitutional:  Negative for malaise/fatigue.  Respiratory:  Negative for shortness of breath.   Neurological:  Positive for headaches.  All other systems reviewed and are negative.      Objective:   Physical  Exam Vitals reviewed.  Constitutional:      General: She is not in acute distress.    Appearance: She is well-developed.  HENT:     Head: Normocephalic and atraumatic.     Right Ear: External ear normal.  Eyes:     Pupils: Pupils are equal, round, and reactive to light.  Neck:     Thyroid: No thyromegaly.  Cardiovascular:     Rate and Rhythm: Normal rate and regular rhythm.     Heart sounds: Normal heart sounds. No murmur heard. Pulmonary:     Effort: Pulmonary effort is normal. No respiratory distress.     Breath sounds: Normal breath sounds. No wheezing.  Abdominal:     General: Bowel sounds are normal. There is no distension.     Palpations: Abdomen is soft.     Tenderness: There is no abdominal tenderness.  Musculoskeletal:        General: No tenderness. Normal range of motion.     Cervical back: Normal range of motion and neck supple.  Skin:    General: Skin is warm and dry.  Neurological:     Mental Status: She is alert and oriented to person, place, and time.     Cranial Nerves: No cranial nerve deficit.     Deep Tendon Reflexes: Reflexes are normal and symmetric.  Psychiatric:        Behavior: Behavior normal.        Thought Content: Thought content normal.  Judgment: Judgment normal.     Right ball of foot cleaned, shaved dry skin off. Removed callus. Pt tolerated well.   BP (!) 173/73   Pulse 71   Temp (!) 97.4 F (36.3 C) (Temporal)   Ht '5\' 3"'$  (1.6 m)   Wt 148 lb (67.1 kg)   BMI 26.22 kg/m      Assessment & Plan:  Kristina Whitaker comes in today with chief complaint of Foreign Body (Stepped on glass 2-3 weeks okay think it might still be in right foot. Patient declines teatnus shot. Patient thinks it might have been a broken glass bottle. Patient was at home. ) and Headache (Patient thinks medication she is on has been causing headaches started 1-2 months ago. Wants medication checked. Patient states she ate a lot of salt is why BP is  up.)   Diagnosis and orders addressed:  1. Foreign body in right foot, initial encounter Removed callus. Did not see glass. I believe this is a corn. Area dug out.  2. Primary hypertension Will increase Norvasc to 10 mg from 5 mg -Daily blood pressure log given with instructions on how to fill out and told to bring to next visit -Dash diet information given -Exercise encouraged - Stress Management  -Continue current meds -RTO in 2 weeks  - amLODipine (NORVASC) 10 MG tablet; Take 1 tablet (10 mg total) by mouth daily.  Dispense: 90 tablet; Refill: 1  3. Acute nonintractable headache, unspecified headache type I believe this is from HTN   Labs pending Health Maintenance reviewed Diet and exercise encouraged  Follow up plan: 2 weeks to recheck HTN  Evelina Dun, FNP

## 2022-05-30 ENCOUNTER — Other Ambulatory Visit: Payer: Self-pay | Admitting: Family

## 2022-05-30 DIAGNOSIS — E785 Hyperlipidemia, unspecified: Secondary | ICD-10-CM

## 2022-06-01 NOTE — Telephone Encounter (Signed)
Made appt 10/24 for med refill

## 2022-06-01 NOTE — Telephone Encounter (Signed)
[facility] NTBS 30 days given 05/07/22

## 2022-06-02 ENCOUNTER — Encounter: Payer: Self-pay | Admitting: Family

## 2022-06-02 ENCOUNTER — Ambulatory Visit: Payer: Medicare HMO | Admitting: Family

## 2022-06-04 ENCOUNTER — Other Ambulatory Visit: Payer: Self-pay | Admitting: Family

## 2022-06-04 DIAGNOSIS — E785 Hyperlipidemia, unspecified: Secondary | ICD-10-CM

## 2022-06-04 NOTE — Telephone Encounter (Signed)
Prescription sent to pharmacy.

## 2022-06-06 ENCOUNTER — Other Ambulatory Visit: Payer: Self-pay | Admitting: Family

## 2022-06-06 DIAGNOSIS — R413 Other amnesia: Secondary | ICD-10-CM

## 2022-06-18 ENCOUNTER — Encounter: Payer: Self-pay | Admitting: Family

## 2022-06-18 ENCOUNTER — Ambulatory Visit (INDEPENDENT_AMBULATORY_CARE_PROVIDER_SITE_OTHER): Payer: Medicare HMO | Admitting: Family

## 2022-06-18 VITALS — BP 155/65 | HR 72 | Temp 97.7°F | Ht 63.0 in | Wt 150.4 lb

## 2022-06-18 DIAGNOSIS — R413 Other amnesia: Secondary | ICD-10-CM | POA: Diagnosis not present

## 2022-06-18 DIAGNOSIS — D329 Benign neoplasm of meninges, unspecified: Secondary | ICD-10-CM | POA: Diagnosis not present

## 2022-06-18 DIAGNOSIS — I10 Essential (primary) hypertension: Secondary | ICD-10-CM | POA: Diagnosis not present

## 2022-06-18 NOTE — Progress Notes (Signed)
Subjective:    Patient ID: Kristina Whitaker, female    DOB: 1950/10/26, 71 y.o.   MRN: [id number]  Chief Complaint  Patient presents with   Medical Management of Chronic Issues    [place] months ago having hesitation in speech, having trouble memory. Patient is losing thought of what she is saying. Denies any weakness just memory issues    PT presents to the office today with complaints of memory changes. States her husband is getting annoyed with her memory changes. She reports she can be talking and forget what she is saying.   She has meningioma and had this removed on 12/04/16. Reports she was having similar memory problems before she had this removed and worried.   Her BP is elevated today. She reports she has not taken her BP medication today.  Hypertension This is a recurrent problem. The current episode started more than 1 year ago. The problem has been waxing and waning since onset. The problem is uncontrolled. Associated symptoms include malaise/fatigue. Pertinent negatives include no peripheral edema or shortness of breath. Past treatments include calcium channel blockers and angiotensin blockers. The current treatment provides mild improvement.      Review of Systems  Constitutional:  Positive for malaise/fatigue.  Respiratory:  Negative for shortness of breath.   All other systems reviewed and are negative.      Objective:   Physical Exam Vitals reviewed.  Constitutional:      General: She is not in acute distress.    Appearance: She is well-developed.  HENT:     Head: Normocephalic and atraumatic.     Right Ear: Tympanic membrane normal.     Left Ear: Tympanic membrane normal.  Eyes:     Pupils: Pupils are equal, round, and reactive to light.  Neck:     Thyroid: No thyromegaly.  Cardiovascular:     Rate and Rhythm: Normal rate and regular rhythm.     Heart sounds: Normal heart sounds. No murmur heard. Pulmonary:     Effort: Pulmonary effort is normal. No  respiratory distress.     Breath sounds: Normal breath sounds. No wheezing.  Abdominal:     General: Bowel sounds are normal. There is no distension.     Palpations: Abdomen is soft.     Tenderness: There is no abdominal tenderness.  Musculoskeletal:        General: No tenderness. Normal range of motion.     Cervical back: Normal range of motion and neck supple.  Skin:    General: Skin is warm and dry.  Neurological:     Mental Status: She is alert and oriented to person, place, and time.     Cranial Nerves: No cranial nerve deficit.     Deep Tendon Reflexes: Reflexes are normal and symmetric.  Psychiatric:        Behavior: Behavior normal.        Thought Content: Thought content normal.        Cognition and Memory: Cognition is impaired. Memory is impaired. She exhibits impaired recent memory and impaired remote memory.        Judgment: Judgment normal.       BP (!) 166/69   Pulse 72   Temp 97.7 F (36.5 C) (Temporal)   Ht '5\' 3"'$  (1.6 m)   Wt 150 lb 6.4 oz (68.2 kg)   BMI 26.64 kg/m      Assessment & Plan:  [name] comes in today with chief complaint of  Medical Management of Chronic Issues ([place] months ago having hesitation in speech, having trouble memory. Patient is losing thought of what she is saying. Denies any weakness just memory issues )   Diagnosis and orders addressed:  1. Primary hypertension   2. Meningioma ([place]) - MR Brain W Wo Contrast; Future  3. Memory change - MR Brain W Wo Contrast; Future   Will order MRI, if negative will start Namenda and Aricept. May need referral to Neurologists.  Health Maintenance reviewed Diet and exercise encouraged  Follow up plan: 2 weeks    Evelina Dun, FNP

## 2022-06-18 NOTE — Patient Instructions (Signed)

## 2022-07-06 ENCOUNTER — Ambulatory Visit (INDEPENDENT_AMBULATORY_CARE_PROVIDER_SITE_OTHER): Payer: Medicare HMO | Admitting: Family

## 2022-07-06 ENCOUNTER — Encounter: Payer: Self-pay | Admitting: Family

## 2022-07-06 VITALS — BP 131/59 | HR 89 | Temp 97.5°F | Ht 63.0 in | Wt 149.2 lb

## 2022-07-06 DIAGNOSIS — R413 Other amnesia: Secondary | ICD-10-CM

## 2022-07-06 DIAGNOSIS — Z85820 Personal history of malignant melanoma of skin: Secondary | ICD-10-CM

## 2022-07-06 DIAGNOSIS — R69 Illness, unspecified: Secondary | ICD-10-CM | POA: Diagnosis not present

## 2022-07-06 DIAGNOSIS — F0390 Unspecified dementia without behavioral disturbance: Secondary | ICD-10-CM | POA: Insufficient documentation

## 2022-07-06 DIAGNOSIS — I10 Essential (primary) hypertension: Secondary | ICD-10-CM | POA: Diagnosis not present

## 2022-07-06 DIAGNOSIS — F03A0 Unspecified dementia, mild, without behavioral disturbance, psychotic disturbance, mood disturbance, and anxiety: Secondary | ICD-10-CM

## 2022-07-06 MED ORDER — MEMANTINE HCL 5 MG PO TABS: 5.0000 mg | ORAL_TABLET | Freq: Two times a day (BID) | ORAL | 2 refills | Status: DC

## 2022-07-06 MED ORDER — DONEPEZIL HCL 5 MG PO TABS: 5.0000 mg | ORAL_TABLET | Freq: Every day | ORAL | 1 refills | Status: DC

## 2022-07-06 NOTE — Patient Instructions (Signed)

## 2022-07-06 NOTE — Progress Notes (Signed)
Subjective:    Patient ID: Kristina Whitaker, female    DOB: 12-May-1951, 71 y.o.   MRN: [id number]  Chief Complaint  Patient presents with   Follow-up   PT presents to the office today to follow up on HTN and memory changes.   She reports she can be talking and forget what she is saying.    She has meningioma and had this removed on 12/04/16. Reports she was having similar memory problems before she had this removed and worried. She has MRI pending for 07/13/22.    Her BP is elevated today.   Hypertension This is a chronic problem. The current episode started more than 1 year ago. The problem has been waxing and waning since onset. The problem is uncontrolled. Pertinent negatives include no malaise/fatigue, peripheral edema or shortness of breath. Risk factors for coronary artery disease include dyslipidemia and sedentary lifestyle. The current treatment provides moderate improvement.      Review of Systems  Constitutional:  Negative for malaise/fatigue.  Respiratory:  Negative for shortness of breath.   All other systems reviewed and are negative.      Objective:   Physical Exam Vitals reviewed.  Constitutional:      General: She is not in acute distress.    Appearance: She is well-developed.  HENT:     Head: Normocephalic and atraumatic.     Right Ear: Tympanic membrane normal.     Left Ear: Tympanic membrane normal.  Eyes:     Pupils: Pupils are equal, round, and reactive to light.  Neck:     Thyroid: No thyromegaly.  Cardiovascular:     Rate and Rhythm: Normal rate and regular rhythm.     Heart sounds: Normal heart sounds. No murmur heard. Pulmonary:     Effort: Pulmonary effort is normal. No respiratory distress.     Breath sounds: Normal breath sounds. No wheezing.  Abdominal:     General: Bowel sounds are normal. There is no distension.     Palpations: Abdomen is soft.     Tenderness: There is no abdominal tenderness.  Musculoskeletal:        General: No  tenderness. Normal range of motion.     Cervical back: Normal range of motion and neck supple.  Skin:    General: Skin is warm and dry.  Neurological:     Mental Status: She is alert and oriented to person, place, and time.     Cranial Nerves: No cranial nerve deficit.     Deep Tendon Reflexes: Reflexes are normal and symmetric.  Psychiatric:        Behavior: Behavior normal.        Thought Content: Thought content normal.        Cognition and Memory: Memory is impaired. She exhibits impaired recent memory and impaired remote memory.        Judgment: Judgment normal.      BP (!) 131/59   Pulse 89   Temp (!) 97.5 F (36.4 C) (Temporal)   Ht '5\' 3"'$  (1.6 m)   Wt 149 lb 3.2 oz (67.7 kg)   BMI 26.43 kg/m       Assessment & Plan:   LAVELLA MYREN comes in today with chief complaint of Follow-up   Diagnosis and orders addressed:  1. Primary hypertension Continue medications   2. History of melanoma  3. Memory change Will start Namenda and Aricpet  Memory strategies discussed  - memantine (NAMENDA) 5 MG tablet; Take 1  tablet (5 mg total) by mouth 2 (two) times daily.  Dispense: 60 tablet; Refill: 2 - donepezil (ARICEPT) 5 MG tablet; Take 1 tablet (5 mg total) by mouth at bedtime.  Dispense: 90 tablet; Refill: 1  4. Mild dementia, unspecified dementia type, unspecified whether behavioral, psychotic, or mood disturbance or anxiety (HCC) - memantine (NAMENDA) 5 MG tablet; Take 1 tablet (5 mg total) by mouth 2 (two) times daily.  Dispense: 60 tablet; Refill: 2 - donepezil (ARICEPT) 5 MG tablet; Take 1 tablet (5 mg total) by mouth at bedtime.  Dispense: 90 tablet; Refill: 1   Labs refused  Health Maintenance reviewed Diet and exercise encouraged  Follow up plan: 6 weeks    Evelina Dun, FNP

## 2022-07-13 ENCOUNTER — Other Ambulatory Visit: Payer: Self-pay | Admitting: Family

## 2022-07-13 ENCOUNTER — Ambulatory Visit (HOSPITAL_COMMUNITY)
Admission: RE | Admit: 2022-07-13 | Discharge: 2022-07-13 | Disposition: A | Discharge status: Home / Self Care | Payer: Medicare HMO | Source: Ambulatory Visit | Attending: Family | Admitting: Family

## 2022-07-13 DIAGNOSIS — D329 Benign neoplasm of meninges, unspecified: Secondary | ICD-10-CM | POA: Insufficient documentation

## 2022-07-13 DIAGNOSIS — R413 Other amnesia: Secondary | ICD-10-CM | POA: Diagnosis not present

## 2022-07-13 DIAGNOSIS — R4182 Altered mental status, unspecified: Secondary | ICD-10-CM | POA: Diagnosis not present

## 2022-07-13 DIAGNOSIS — I10 Essential (primary) hypertension: Secondary | ICD-10-CM

## 2022-08-09 ENCOUNTER — Other Ambulatory Visit: Payer: Self-pay | Admitting: Family

## 2022-08-09 DIAGNOSIS — I10 Essential (primary) hypertension: Secondary | ICD-10-CM

## 2022-08-17 ENCOUNTER — Ambulatory Visit (INDEPENDENT_AMBULATORY_CARE_PROVIDER_SITE_OTHER): Payer: Medicare HMO | Admitting: Family

## 2022-08-17 ENCOUNTER — Encounter: Payer: Self-pay | Admitting: Family

## 2022-08-17 VITALS — BP 141/77 | HR 60 | Temp 98.7°F | Ht 63.0 in | Wt 147.6 lb

## 2022-08-17 DIAGNOSIS — F03A0 Unspecified dementia, mild, without behavioral disturbance, psychotic disturbance, mood disturbance, and anxiety: Secondary | ICD-10-CM | POA: Diagnosis not present

## 2022-08-17 DIAGNOSIS — D329 Benign neoplasm of meninges, unspecified: Secondary | ICD-10-CM

## 2022-08-17 DIAGNOSIS — I10 Essential (primary) hypertension: Secondary | ICD-10-CM

## 2022-08-17 DIAGNOSIS — E785 Hyperlipidemia, unspecified: Secondary | ICD-10-CM

## 2022-08-17 DIAGNOSIS — R69 Illness, unspecified: Secondary | ICD-10-CM | POA: Diagnosis not present

## 2022-08-17 MED ORDER — MEMANTINE HCL 10 MG PO TABS: 10.0000 mg | ORAL_TABLET | Freq: Two times a day (BID) | ORAL | 1 refills | Status: DC

## 2022-08-17 MED ORDER — DONEPEZIL HCL 10 MG PO TABS: 10.0000 mg | ORAL_TABLET | Freq: Every day | ORAL | 1 refills | Status: DC

## 2022-08-17 NOTE — Patient Instructions (Signed)
Hypertension, Adult High blood pressure (hypertension) is when the force of blood pumping through the arteries is too strong. The arteries are the blood vessels that carry blood from the heart throughout the body. Hypertension forces the heart to work harder to pump blood and may cause arteries to become narrow or stiff. Untreated or uncontrolled hypertension can lead to a heart attack, heart failure, a stroke, kidney disease, and other problems. A blood pressure reading consists of a higher number over a lower number. Ideally, your blood pressure should be below 120/80. The first ("top") number is called the systolic pressure. It is a measure of the pressure in your arteries as your heart beats. The second ("bottom") number is called the diastolic pressure. It is a measure of the pressure in your arteries as the heart relaxes. What are the causes? The exact cause of this condition is not known. There are some conditions that result in high blood pressure. What increases the risk? Certain factors may make you more likely to develop high blood pressure. Some of these risk factors are under your control, including: Smoking. Not getting enough exercise or physical activity. Being overweight. Having too much fat, sugar, calories, or salt (sodium) in your diet. Drinking too much alcohol. Other risk factors include: Having a personal history of heart disease, diabetes, high cholesterol, or kidney disease. Stress. Having a family history of high blood pressure and high cholesterol. Having obstructive sleep apnea. Age. The risk increases with age. What are the signs or symptoms? High blood pressure may not cause symptoms. Very high blood pressure (hypertensive crisis) may cause: Headache. Fast or irregular heartbeats (palpitations). Shortness of breath. Nosebleed. Nausea and vomiting. Vision changes. Severe chest pain, dizziness, and seizures. How is this diagnosed? This condition is diagnosed by  measuring your blood pressure while you are seated, with your arm resting on a flat surface, your legs uncrossed, and your feet flat on the floor. The cuff of the blood pressure monitor will be placed directly against the skin of your upper arm at the level of your heart. Blood pressure should be measured at least twice using the same arm. Certain conditions can cause a difference in blood pressure between your right and left arms. If you have a high blood pressure reading during one visit or you have normal blood pressure with other risk factors, you may be asked to: Return on a different day to have your blood pressure checked again. Monitor your blood pressure at home for 1 week or longer. If you are diagnosed with hypertension, you may have other blood or imaging tests to help your health care provider understand your overall risk for other conditions. How is this treated? This condition is treated by making healthy lifestyle changes, such as eating healthy foods, exercising more, and reducing your alcohol intake. You may be referred for counseling on a healthy diet and physical activity. Your health care provider may prescribe medicine if lifestyle changes are not enough to get your blood pressure under control and if: Your systolic blood pressure is above 130. Your diastolic blood pressure is above 80. Your personal target blood pressure may vary depending on your medical conditions, your age, and other factors. Follow these instructions at home: Eating and drinking  Eat a diet that is high in fiber and potassium, and low in sodium, added sugar, and fat. An example of this eating plan is called the DASH diet. DASH stands for Dietary Approaches to Stop Hypertension. To eat this way: Eat   plenty of fresh fruits and vegetables. Try to fill one half of your plate at each meal with fruits and vegetables. Eat whole grains, such as whole-wheat pasta, brown rice, or whole-grain bread. Fill about one  fourth of your plate with whole grains. Eat or drink low-fat dairy products, such as skim milk or low-fat yogurt. Avoid fatty cuts of meat, processed or cured meats, and poultry with skin. Fill about one fourth of your plate with lean proteins, such as fish, chicken without skin, beans, eggs, or tofu. Avoid pre-made and processed foods. These tend to be higher in sodium, added sugar, and fat. Reduce your daily sodium intake. Many people with hypertension should eat less than 1,500 mg of sodium a day. Do not drink alcohol if: Your health care provider tells you not to drink. You are pregnant, may be pregnant, or are planning to become pregnant. If you drink alcohol: Limit how much you have to: 0-1 drink a day for women. 0-2 drinks a day for men. Know how much alcohol is in your drink. In the [place] one drink equals one 12 oz bottle of beer (355 mL), one 5 oz glass of wine (148 mL), or one 1 oz glass of hard liquor (44 mL). Lifestyle  Work with your health care provider to maintain a healthy body weight or to lose weight. Ask what an ideal weight is for you. Get at least 30 minutes of exercise that causes your heart to beat faster (aerobic exercise) most days of the week. Activities may include walking, swimming, or biking. Include exercise to strengthen your muscles (resistance exercise), such as Pilates or lifting weights, as part of your weekly exercise routine. Try to do these types of exercises for 30 minutes at least 3 days a week. Do not use any products that contain nicotine or tobacco. These products include cigarettes, chewing tobacco, and vaping devices, such as e-cigarettes. If you need help quitting, ask your health care provider. Monitor your blood pressure at home as told by your health care provider. Keep all follow-up visits. This is important. Medicines Take over-the-counter and prescription medicines only as told by your health care provider. Follow directions carefully. Blood  pressure medicines must be taken as prescribed. Do not skip doses of blood pressure medicine. Doing this puts you at risk for problems and can make the medicine less effective. Ask your health care provider about side effects or reactions to medicines that you should watch for. Contact a health care provider if you: Think you are having a reaction to a medicine you are taking. Have headaches that keep coming back (recurring). Feel dizzy. Have swelling in your ankles. Have trouble with your vision. Get help right away if you: Develop a severe headache or confusion. Have unusual weakness or numbness. Feel faint. Have severe pain in your chest or abdomen. Vomit repeatedly. Have trouble breathing. These symptoms may be an emergency. Get help right away. Call 911. Do not wait to see if the symptoms will go away. Do not drive yourself to the hospital. Summary Hypertension is when the force of blood pumping through your arteries is too strong. If this condition is not controlled, it may put you at risk for serious complications. Your personal target blood pressure may vary depending on your medical conditions, your age, and other factors. For most people, a normal blood pressure is less than 120/80. Hypertension is treated with lifestyle changes, medicines, or a combination of both. Lifestyle changes include losing weight, eating a healthy,   low-sodium diet, exercising more, and limiting alcohol. This information is not intended to replace advice given to you by your health care provider. Make sure you discuss any questions you have with your health care provider. Document Revised: 06/03/2021 Document Reviewed: 06/03/2021 Elsevier Patient Education  2023 [employer]  

## 2022-08-17 NOTE — Progress Notes (Signed)
Subjective:    Patient ID: Kristina Whitaker, female    DOB: June 05, 1951, 72 y.o.   MRN: [id number]  Chief Complaint  Patient presents with   Medical Management of Chronic Issues   PT presents to the office today to follow up on memory changes. She was started on Namenda and Aricpet on 07/06/22. States she will continue to forget what she is saying in middle of her sentence. Reports it might be slightly better since starting medications.   She has meningioma and had this removed on 12/04/16. Reports she was having similar memory problems before she had this removed and worried. She had MRI for 07/13/22 that, "No acute or reversible finding. Mild chronic small-vessel ischemic change of the cerebral hemispheric white matter." Hypertension This is a chronic problem. The current episode started more than 1 year ago. The problem has been waxing and waning since onset. The problem is uncontrolled. Pertinent negatives include no malaise/fatigue, peripheral edema or shortness of breath. Risk factors for coronary artery disease include dyslipidemia and sedentary lifestyle. The current treatment provides moderate improvement.  Hyperlipidemia This is a chronic problem. The current episode started more than 1 year ago. Pertinent negatives include no shortness of breath. Current antihyperlipidemic treatment includes statins. The current treatment provides moderate improvement of lipids. Risk factors for coronary artery disease include dyslipidemia, hypertension and a sedentary lifestyle.      Review of Systems  Constitutional:  Negative for malaise/fatigue.  Respiratory:  Negative for shortness of breath.   All other systems reviewed and are negative.      Objective:   Physical Exam Vitals reviewed.  Constitutional:      General: She is not in acute distress.    Appearance: She is well-developed.  HENT:     Head: Normocephalic and atraumatic.     Right Ear: Tympanic membrane normal.     Left Ear:  Tympanic membrane normal.  Eyes:     Pupils: Pupils are equal, round, and reactive to light.  Neck:     Thyroid: No thyromegaly.  Cardiovascular:     Rate and Rhythm: Normal rate and regular rhythm.     Heart sounds: Normal heart sounds. No murmur heard. Pulmonary:     Effort: Pulmonary effort is normal. No respiratory distress.     Breath sounds: Normal breath sounds. No wheezing.  Abdominal:     General: Bowel sounds are normal. There is no distension.     Palpations: Abdomen is soft.     Tenderness: There is no abdominal tenderness.  Musculoskeletal:        General: No tenderness. Normal range of motion.     Cervical back: Normal range of motion and neck supple.  Skin:    General: Skin is warm and dry.  Neurological:     Mental Status: She is alert and oriented to person, place, and time.     Cranial Nerves: No cranial nerve deficit.     Deep Tendon Reflexes: Reflexes are normal and symmetric.  Psychiatric:        Behavior: Behavior normal.        Thought Content: Thought content normal.        Judgment: Judgment normal.       BP (!) 149/65   Pulse 60   Temp 98.7 F (37.1 C)   Ht '5\' 3"'$  (1.6 m)   Wt 147 lb 9.6 oz (67 kg)   SpO2 99%   BMI 26.15 kg/m      Assessment &  Plan:  Kristina ROMANIELLO comes in today with chief complaint of Medical Management of Chronic Issues   Diagnosis and orders addressed:  1. Primary hypertension  2. Meningioma ([place])  3. Hyperlipidemia, unspecified hyperlipidemia type  4. Mild dementia without behavioral disturbance, psychotic disturbance, mood disturbance, or anxiety, unspecified dementia type (HCC) - memantine (NAMENDA) 10 MG tablet; Take 1 tablet (10 mg total) by mouth 2 (two) times daily.  Dispense: 180 tablet; Refill: 1 - donepezil (ARICEPT) 10 MG tablet; Take 1 tablet (10 mg total) by mouth at bedtime.  Dispense: 90 tablet; Refill: 1   Labs  refuses  Increased Namenda and Aricept to '10mg'$   from 5 mg Encouraged to write things  down Health Maintenance reviewed Diet and exercise encouraged  Follow up plan: 3 months    Kristina Dun, FNP

## 2022-08-20 ENCOUNTER — Telehealth: Payer: Self-pay | Admitting: Family

## 2022-08-20 NOTE — Telephone Encounter (Signed)
Pt says PCP recently changed some of her meds and ever since taking them she has felt nauseous. Wants to know if this is normal or if something needs to be changed again?

## 2022-08-21 NOTE — Telephone Encounter (Signed)
Left message to call back.

## 2022-08-21 NOTE — Telephone Encounter (Signed)
The memory medication can cause this. Try to continue the medication for a few weeks and see if this gets better.   Evelina Dun, FNP

## 2022-08-21 NOTE — Telephone Encounter (Signed)
Patient aware and verbalized understanding.

## 2022-10-26 ENCOUNTER — Other Ambulatory Visit (HOSPITAL_COMMUNITY): Payer: Self-pay | Admitting: Family

## 2022-10-26 ENCOUNTER — Telehealth: Payer: Self-pay | Admitting: Family

## 2022-10-26 DIAGNOSIS — Z1231 Encounter for screening mammogram for malignant neoplasm of breast: Secondary | ICD-10-CM

## 2022-11-06 ENCOUNTER — Other Ambulatory Visit: Payer: Self-pay | Admitting: Family

## 2022-11-06 DIAGNOSIS — I10 Essential (primary) hypertension: Secondary | ICD-10-CM

## 2022-11-09 ENCOUNTER — Ambulatory Visit (INDEPENDENT_AMBULATORY_CARE_PROVIDER_SITE_OTHER): Payer: Medicare HMO

## 2022-11-09 VITALS — Ht 62.0 in | Wt 137.0 lb

## 2022-11-09 DIAGNOSIS — Z0000 Encounter for general adult medical examination without abnormal findings: Secondary | ICD-10-CM | POA: Diagnosis not present

## 2022-11-09 NOTE — Patient Instructions (Signed)
Kristina Whitaker , Thank you for taking time to come for your Medicare Wellness Visit. I appreciate your ongoing commitment to your health goals. Please review the following plan we discussed and let me know if I can assist you in the future.   These are the goals we discussed:  Goals      Patient Stated     11/07/2021 AWV Goal: Exercise for General Health  Patient will verbalize understanding of the benefits of increased physical activity: Exercising regularly is important. It will improve your overall fitness, flexibility, and endurance. Regular exercise also will improve your overall health. It can help you control your weight, reduce stress, and improve your bone density. Over the next year, patient will increase physical activity as tolerated with a goal of at least 150 minutes of moderate physical activity per week.  You can tell that you are exercising at a moderate intensity if your heart starts beating faster and you start breathing faster but can still hold a conversation. Moderate-intensity exercise ideas include: Walking 1 mile (1.6 km) in about 15 minutes Biking Hiking Golfing Dancing Water aerobics Patient will verbalize understanding of everyday activities that increase physical activity by providing examples like the following: Yard work, such as: Sales promotion account executive Gardening Washing windows or floors Patient will be able to explain general safety guidelines for exercising:  Before you start a new exercise program, talk with your health care provider. Do not exercise so much that you hurt yourself, feel dizzy, or get very short of breath. Wear comfortable clothes and wear shoes with good support. Drink plenty of water while you exercise to prevent dehydration or heat stroke. Work out until your breathing and your heartbeat get faster.      Patient Stated     11/05/2020 AWV Goal: Fall  Prevention  Over the next year, patient will decrease their risk for falls by: Using assistive devices, such as a cane or walker, as needed Identifying fall risks within their home and correcting them by: Removing throw rugs Adding handrails to stairs or ramps Removing clutter and keeping a clear pathway throughout the home Increasing light, especially at night Adding shower handles/bars Raising toilet seat Identifying potential personal risk factors for falls: Medication side effects Incontinence/urgency Vestibular dysfunction Hearing loss Musculoskeletal disorders Neurological disorders Orthostatic hypotension          This is a list of the screening recommended for you and due dates:  Health Maintenance  Topic Date Due   Zoster (Shingles) Vaccine (1 of 2) Never done   DTaP/Tdap/Td vaccine (2 - Tdap) 11/14/2013   COVID-19 Vaccine (2 - Janssen risk series) 11/18/2019   Cologuard (Stool DNA test)  05/23/2023*   Pneumonia Vaccine (1 of 1 - PCV) 07/07/2023*   Mammogram  11/25/2022   DEXA scan (bone density measurement)  12/11/2022   Flu Shot  03/11/2023   Medicare Annual Wellness Visit  11/09/2023   Hepatitis C Screening: USPSTF Recommendation to screen - Ages 18-79 yo.  Completed   HPV Vaccine  Aged Out  *Topic was postponed. The date shown is not the original due date.    Advanced directives: Advance directive discussed with you today. I have provided a copy for you to complete at home and have notarized. Once this is complete please bring a copy in to our office so we can scan it into your chart.   Conditions/risks identified: Aim for 30 minutes  of exercise or brisk walking, 6-8 glasses of water, and 5 servings of fruits and vegetables each day.   Next appointment: Follow up in one year for your annual wellness visit    Preventive Care 65 Years and Older, Female Preventive care refers to lifestyle choices and visits with your health care provider that can promote  health and wellness. What does preventive care include? A yearly physical exam. This is also called an annual well check. Dental exams once or twice a year. Routine eye exams. Ask your health care provider how often you should have your eyes checked. Personal lifestyle choices, including: Daily care of your teeth and gums. Regular physical activity. Eating a healthy diet. Avoiding tobacco and drug use. Limiting alcohol use. Practicing safe sex. Taking low-dose aspirin every day. Taking vitamin and mineral supplements as recommended by your health care provider. What happens during an annual well check? The services and screenings done by your health care provider during your annual well check will depend on your age, overall health, lifestyle risk factors, and family history of disease. Counseling  Your health care provider may ask you questions about your: Alcohol use. Tobacco use. Drug use. Emotional well-being. Home and relationship well-being. Sexual activity. Eating habits. History of falls. Memory and ability to understand (cognition). Work and work Statistician. Reproductive health. Screening  You may have the following tests or measurements: Height, weight, and BMI. Blood pressure. Lipid and cholesterol levels. These may be checked every 5 years, or more frequently if you are over 13 years old. Skin check. Lung cancer screening. You may have this screening every year starting at age 61 if you have a 30-pack-year history of smoking and currently smoke or have quit within the past 15 years. Fecal occult blood test (FOBT) of the stool. You may have this test every year starting at age 28. Flexible sigmoidoscopy or colonoscopy. You may have a sigmoidoscopy every 5 years or a colonoscopy every 10 years starting at age 93. Hepatitis C blood test. Hepatitis B blood test. Sexually transmitted disease (STD) testing. Diabetes screening. This is done by checking your blood sugar  (glucose) after you have not eaten for a while (fasting). You may have this done every 1-3 years. Bone density scan. This is done to screen for osteoporosis. You may have this done starting at age 58. Mammogram. This may be done every 1-2 years. Talk to your health care provider about how often you should have regular mammograms. Talk with your health care provider about your test results, treatment options, and if necessary, the need for more tests. Vaccines  Your health care provider may recommend certain vaccines, such as: Influenza vaccine. This is recommended every year. Tetanus, diphtheria, and acellular pertussis (Tdap, Td) vaccine. You may need a Td booster every 10 years. Zoster vaccine. You may need this after age 2. Pneumococcal 13-valent conjugate (PCV13) vaccine. One dose is recommended after age 59. Pneumococcal polysaccharide (PPSV23) vaccine. One dose is recommended after age 59. Talk to your health care provider about which screenings and vaccines you need and how often you need them. This information is not intended to replace advice given to you by your health care provider. Make sure you discuss any questions you have with your health care provider. Document Released: 08/23/2015 Document Revised: 04/15/2016 Document Reviewed: 05/28/2015 Elsevier Interactive Patient Education  2017 [employer] Prevention in the Home Falls can cause injuries. They can happen to people of all ages. There are many things you  can do to make your home safe and to help prevent falls. What can I do on the outside of my home? Regularly fix the edges of walkways and driveways and fix any cracks. Remove anything that might make you trip as you walk through a door, such as a raised step or threshold. Trim any bushes or trees on the path to your home. Use bright outdoor lighting. Clear any walking paths of anything that might make someone trip, such as rocks or tools. Regularly check to see  if handrails are loose or broken. Make sure that both sides of any steps have handrails. Any raised decks and porches should have guardrails on the edges. Have any leaves, snow, or ice cleared regularly. Use sand or salt on walking paths during winter. Clean up any spills in your garage right away. This includes oil or grease spills. What can I do in the bathroom? Use night lights. Install grab bars by the toilet and in the tub and shower. Do not use towel bars as grab bars. Use non-skid mats or decals in the tub or shower. If you need to sit down in the shower, use a plastic, non-slip stool. Keep the floor dry. Clean up any water that spills on the floor as soon as it happens. Remove soap buildup in the tub or shower regularly. Attach bath mats securely with double-sided non-slip rug tape. Do not have throw rugs and other things on the floor that can make you trip. What can I do in the bedroom? Use night lights. Make sure that you have a light by your bed that is easy to reach. Do not use any sheets or blankets that are too big for your bed. They should not hang down onto the floor. Have a firm chair that has side arms. You can use this for support while you get dressed. Do not have throw rugs and other things on the floor that can make you trip. What can I do in the kitchen? Clean up any spills right away. Avoid walking on wet floors. Keep items that you use a lot in easy-to-reach places. If you need to reach something above you, use a strong step stool that has a grab bar. Keep electrical cords out of the way. Do not use floor polish or wax that makes floors slippery. If you must use wax, use non-skid floor wax. Do not have throw rugs and other things on the floor that can make you trip. What can I do with my stairs? Do not leave any items on the stairs. Make sure that there are handrails on both sides of the stairs and use them. Fix handrails that are broken or loose. Make sure that  handrails are as long as the stairways. Check any carpeting to make sure that it is firmly attached to the stairs. Fix any carpet that is loose or worn. Avoid having throw rugs at the top or bottom of the stairs. If you do have throw rugs, attach them to the floor with carpet tape. Make sure that you have a light switch at the top of the stairs and the bottom of the stairs. If you do not have them, ask someone to add them for you. What else can I do to help prevent falls? Wear shoes that: Do not have high heels. Have rubber bottoms. Are comfortable and fit you well. Are closed at the toe. Do not wear sandals. If you use a stepladder: Make sure that it is fully  opened. Do not climb a closed stepladder. Make sure that both sides of the stepladder are locked into place. Ask someone to hold it for you, if possible. Clearly mark and make sure that you can see: Any grab bars or handrails. First and last steps. Where the edge of each step is. Use tools that help you move around (mobility aids) if they are needed. These include: Canes. Walkers. Scooters. Crutches. Turn on the lights when you go into a dark area. Replace any light bulbs as soon as they burn out. Set up your furniture so you have a clear path. Avoid moving your furniture around. If any of your floors are uneven, fix them. If there are any pets around you, be aware of where they are. Review your medicines with your doctor. Some medicines can make you feel dizzy. This can increase your chance of falling. Ask your doctor what other things that you can do to help prevent falls. This information is not intended to replace advice given to you by your health care provider. Make sure you discuss any questions you have with your health care provider. Document Released: 05/23/2009 Document Revised: 01/02/2016 Document Reviewed: 08/31/2014 Elsevier Interactive Patient Education  2017 [employer].

## 2022-11-09 NOTE — Progress Notes (Signed)
Subjective:   Kristina Whitaker is a 72 y.o. female who presents for Medicare Annual (Subsequent) preventive examination. I connected with  [name] on 11/09/22 by a audio enabled telemedicine application and verified that I am speaking with the correct person using two identifiers.  Patient Location: Home  Provider Location: Home Office  I discussed the limitations of evaluation and management by telemedicine. The patient expressed understanding and agreed to proceed.  Review of Systems     Cardiac Risk Factors include: advanced age (>56men, >92 women);dyslipidemia;hypertension     Objective:    Today's Vitals   11/09/22 1432  Weight: 137 lb (62.1 kg)  Height: 5\' 2"  (1.575 m)   Body mass index is 25.06 kg/m.     11/09/2022    2:40 PM 11/07/2021    8:52 AM 11/05/2020    8:20 AM 11/03/2019    9:45 AM  Advanced Directives  Does Patient Have a Medical Advance Directive? No Yes Yes Yes  Type of Corporate treasurer of [place];Living will Living will [place];Living will;Out of facility DNR (pink MOST or yellow form)  Does patient want to make changes to medical advance directive?   No - Patient declined   Copy of [place] in Chart?  No - copy requested    Would patient like information on creating a medical advance directive? No - Patient declined       Current Medications (verified) Outpatient Encounter Medications as of 11/09/2022  Medication Sig   alendronate (FOSAMAX) 70 MG tablet TAKE 1 TABLET ON AN EMPTY STOMACH WITH A FULL GLASS OF WATER EVERY 7 DAYS   amLODipine (NORVASC) 10 MG tablet Take 1 tablet (10 mg total) by mouth daily.   aspirin 81 MG tablet Take 81 mg by mouth daily.   donepezil (ARICEPT) 10 MG tablet Take 1 tablet (10 mg total) by mouth at bedtime.   losartan (COZAAR) 100 MG tablet TAKE 1 TABLET BY MOUTH EVERY DAY   rosuvastatin (CRESTOR) 10 MG tablet Take 1 tablet (10 mg total) by mouth daily.    memantine (NAMENDA) 10 MG tablet Take 1 tablet (10 mg total) by mouth 2 (two) times daily. (Patient not taking: Reported on 11/09/2022)   No facility-administered encounter medications on file as of 11/09/2022.    Allergies (verified) Diethylpropion, Penicillins, Sulfa antibiotics, and Sulfa drugs cross reactors   History: Past Medical History:  Diagnosis Date   Cancer    DVT (deep venous thrombosis) 2015   Fibroids    uterine around age 50   Hypertension    Melanoma in situ of back    1991   Ovarian tumor 2011   LMP   Past Surgical History:  Procedure Laterality Date   BACK SURGERY     melanoma exicision from back   BREAST SURGERY     bilateral fibroadenomas/ cyst aspiration   FACIAL COSMETIC SURGERY     2006   fatty tumor removed from brian stem     OVARIAN CYST REMOVAL     x2 with rupture x2 in her 28s and 87s   SKIN SURGERY     between toes   Family History  Problem Relation Age of Onset   Heart attack Sister    Stroke Sister    Heart attack Maternal Aunt    Heart attack Paternal Uncle    Stroke Father    Heart attack Father    Heart attack Brother  Social History   Socioeconomic History   Marital status: Married    Spouse name: [name]   Number of children: 1   Years of education: 16   Highest education level: Not on file  Occupational History   Occupation: retired    Comment: Chief Financial Officer  Tobacco Use   Smoking status: Never   Smokeless tobacco: Never  Scientific laboratory technician Use: Not on file  Substance and Sexual Activity   Alcohol use: Not Currently    Alcohol/week: 1.0 standard drink of alcohol    Types: 1 Glasses of wine per week    Comment: occasional glass of wine   Drug use: No   Sexual activity: Yes  Other Topics Concern   Not on file  Social History Narrative   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    She lives at home with her husband [name].    They have one son that lives [place].    Kyan enjoys traveling.    She has an outside cat and an inside  Careers adviser.    Social Determinants of Health   Financial Resource Strain: Low Risk  (11/09/2022)   Overall Financial Resource Strain (CARDIA)    Difficulty of Paying Living Expenses: Not hard at all  Food Insecurity: No Food Insecurity (11/09/2022)   Hunger Vital Sign    Worried About Running Out of Food in the Last Year: Never true    Ran Out of Food in the Last Year: Never true  Transportation Needs: No Transportation Needs (11/09/2022)   PRAPARE - Hydrologist (Medical): No    Lack of Transportation (Non-Medical): No  Physical Activity: Insufficiently Active (11/09/2022)   Exercise Vital Sign    Days of Exercise per Week: 3 days    Minutes of Exercise per Session: 30 min  Stress: No Stress Concern Present (11/09/2022)   [place]    Feeling of Stress : Not at all  Social Connections: Moderately Integrated (11/09/2022)   Social Connection and Isolation Panel [NHANES]    Frequency of Communication with Friends and Family: More than three times a week    Frequency of Social Gatherings with Friends and Family: More than three times a week    Attends Religious Services: 1 to 4 times per year    Active Member of [employer] or Organizations: No    Attends Music therapist: Never    Marital Status: Married    Tobacco Counseling Counseling given: Not Answered   Clinical Intake:  Pre-visit preparation completed: Yes  Pain : No/denies pain     Nutritional Risks: None Diabetes: No  How often do you need to have someone help you when you read instructions, pamphlets, or other written materials from your doctor or pharmacy?: 1 - Never  Diabetic?no   Interpreter Needed?: No  Information entered by :: Jadene Pierini, LPN   Activities of Daily Living    11/09/2022    2:40 PM  In your present state of health, do you have any difficulty performing the following activities:  Hearing? 0   Vision? 0  Difficulty concentrating or making decisions? 0  Walking or climbing stairs? 0  Dressing or bathing? 0  Doing errands, shopping? 0  Preparing Food and eating ? N  Using the Toilet? N  In the past six months, have you accidently leaked urine? N  Do you have problems with loss of bowel control? N  Managing  your Medications? N  Managing your Finances? N  Housekeeping or managing your Housekeeping? N    Patient Care Team: Sharion Balloon, FNP as PCP - General (Family Medicine) Loretta Plume (Neurosurgery) [facility] (Ophthalmology) Allyn Kenner, MD (Dermatology)  Indicate any recent Medical Services you may have received from other than [facility] providers in the past year (date may be approximate).     Assessment:   This is a routine wellness examination for [facility].  Hearing/Vision screen Vision Screening - Comments:: Wears rx glasses - up to date with routine eye exams with  Dr.Lee   Dietary issues and exercise activities discussed: Current Exercise Habits: Home exercise routine, Type of exercise: walking, Time (Minutes): 30, Frequency (Times/Week): 3, Weekly Exercise (Minutes/Week): 90, Intensity: Mild, Exercise limited by: None identified   Goals Addressed             This Visit's Progress    Patient Stated   On track    11/07/2021 AWV Goal: Exercise for General Health  Patient will verbalize understanding of the benefits of increased physical activity: Exercising regularly is important. It will improve your overall fitness, flexibility, and endurance. Regular exercise also will improve your overall health. It can help you control your weight, reduce stress, and improve your bone density. Over the next year, patient will increase physical activity as tolerated with a goal of at least 150 minutes of moderate physical activity per week.  You can tell that you are exercising at a moderate intensity if your heart starts beating faster and you  start breathing faster but can still hold a conversation. Moderate-intensity exercise ideas include: Walking 1 mile (1.6 km) in about 15 minutes Biking Hiking Golfing Dancing Water aerobics Patient will verbalize understanding of everyday activities that increase physical activity by providing examples like the following: Yard work, such as: Sales promotion account executive Gardening Washing windows or floors Patient will be able to explain general safety guidelines for exercising:  Before you start a new exercise program, talk with your health care provider. Do not exercise so much that you hurt yourself, feel dizzy, or get very short of breath. Wear comfortable clothes and wear shoes with good support. Drink plenty of water while you exercise to prevent dehydration or heat stroke. Work out until your breathing and your heartbeat get faster.        Depression Screen    11/09/2022    2:39 PM 08/17/2022   10:01 AM 07/06/2022   11:51 AM 06/18/2022    9:38 AM 03/10/2022    2:55 PM 11/12/2021   10:05 AM 11/07/2021    8:31 AM  PHQ 2/9 Scores  PHQ - 2 Score 0 1 2 1  0 0 0  PHQ- 9 Score  2 5 1 1 1      Fall Risk    11/09/2022    2:34 PM 11/09/2022    2:33 PM 08/17/2022   10:02 AM 07/06/2022   11:43 AM 06/18/2022    9:38 AM  Fall Risk   Falls in the past year? 1 0 1 1 1   Number falls in past yr: 1 0 0 1 1  Injury with Fall? 1 0 0 1 1  Risk for fall due to : History of fall(s);Impaired balance/gait;Orthopedic patient No Fall Risks History of fall(s) History of fall(s) History of fall(s)  Follow up Education provided;Falls prevention discussed Falls prevention discussed Falls evaluation completed Falls  evaluation completed Falls evaluation completed    FALL RISK PREVENTION PERTAINING TO THE HOME:  Any stairs in or around the home? Yes  If so, are there any without handrails? No  Home free of loose throw rugs in walkways,  pet beds, electrical cords, etc? Yes  Adequate lighting in your home to reduce risk of falls? Yes   ASSISTIVE DEVICES UTILIZED TO PREVENT FALLS:  Life alert? No  Use of a cane, walker or w/c? No  Grab bars in the bathroom? Yes  Shower chair or bench in shower? Yes  Elevated toilet seat or a handicapped toilet? Yes       06/10/2021    8:21 AM  MMSE - Mini Mental State Exam  Orientation to time 5  Orientation to Place 5  Registration 3  Attention/ Calculation 5  Recall 0  Language- name 2 objects 2  Language- repeat 1  Language- follow 3 step command 2  Language- read & follow direction 1  Write a sentence 1  Copy design 0  Total score 25        11/09/2022    2:41 PM 06/18/2022    9:40 AM 11/07/2021    8:35 AM 11/05/2020    8:24 AM 11/03/2019    9:48 AM  6CIT Screen  What Year? 0 points 4 points 0 points 0 points 0 points  What month? 0 points 0 points 0 points 0 points 0 points  What time? 3 points 0 points 0 points 0 points 0 points  Count back from 20 2 points 4 points 0 points 0 points 2 points  Months in reverse 4 points 4 points 2 points 0 points 2 points  Repeat phrase 8 points 10 points 6 points 0 points 0 points  Total Score 17 points 22 points 8 points 0 points 4 points    Immunizations Immunization History  Administered Date(s) Administered   Hepatitis A 06/07/1998, 03/28/1999   Hepatitis A, Adult 06/07/1998, 03/28/1999   Hepatitis B 11/15/2003   Influenza, Quadrivalent, Recombinant, Inj, Pf 05/25/2019   Janssen (J&J) SARS-COV-2 Vaccination 10/21/2019   Meningococcal Conjugate 04/27/2003   Meningococcal polysaccharide vaccine (MPSV4) 04/27/2003   Td 11/15/2003   Typhoid Inactivated 04/27/2003    TDAP status: Due, Education has been provided regarding the importance of this vaccine. Advised may receive this vaccine at local pharmacy or Health Dept. Aware to provide a copy of the vaccination record if obtained from local pharmacy or Health Dept. Verbalized  acceptance and understanding.  Flu Vaccine status: Declined, Education has been provided regarding the importance of this vaccine but patient still declined. Advised may receive this vaccine at local pharmacy or Health Dept. Aware to provide a copy of the vaccination record if obtained from local pharmacy or Health Dept. Verbalized acceptance and understanding.  Pneumococcal vaccine status: Due, Education has been provided regarding the importance of this vaccine. Advised may receive this vaccine at local pharmacy or Health Dept. Aware to provide a copy of the vaccination record if obtained from local pharmacy or Health Dept. Verbalized acceptance and understanding.  Covid-19 vaccine status: Completed vaccines  Qualifies for Shingles Vaccine? Yes   Zostavax completed No   Shingrix Completed?: No.    Education has been provided regarding the importance of this vaccine. Patient has been advised to call insurance company to determine out of pocket expense if they have not yet received this vaccine. Advised may also receive vaccine at local pharmacy or Health Dept. Verbalized acceptance and understanding.  Screening Tests Health Maintenance  Topic Date Due   Zoster Vaccines- Shingrix (1 of 2) Never done   DTaP/Tdap/Td (2 - Tdap) 11/14/2013   COVID-19 Vaccine (2 - Janssen risk series) 11/18/2019   Fecal DNA (Cologuard)  05/23/2023 (Originally 10/28/1994)   Pneumonia Vaccine 41+ Years old (1 of 1 - PCV) 07/07/2023 (Originally 10/28/2014)   MAMMOGRAM  11/25/2022   DEXA SCAN  12/11/2022   INFLUENZA VACCINE  03/11/2023   Medicare Annual Wellness (AWV)  11/09/2023   Hepatitis C Screening  Completed   HPV VACCINES  Aged Out    Health Maintenance  Health Maintenance Due  Topic Date Due   Zoster Vaccines- Shingrix (1 of 2) Never done   DTaP/Tdap/Td (2 - Tdap) 11/14/2013   COVID-19 Vaccine (2 - Janssen risk series) 11/18/2019    Colorectal cancer screening: Referral to GI placed declined will  discuss with Pcp . Pt aware the office will call re: appt.  Mammogram status: Completed 11/30/2022. Repeat every year  Bone Density status: Completed 12/11/2022. Results reflect: Bone density results: OSTEOPOROSIS. Repeat every 2 years.  Lung Cancer Screening: (Low Dose CT Chest recommended if Age 74-80 years, 30 pack-year currently smoking OR have quit w/in 15years.) does not qualify.   Lung Cancer Screening Referral: n/a  Additional Screening:  Hepatitis C Screening: does not qualify; Completed 09/24/2020  Vision Screening: Recommended annual ophthalmology exams for early detection of glaucoma and other disorders of the eye. Is the patient up to date with their annual eye exam?  Yes  Who is the provider or what is the name of the office in which the patient attends annual eye exams? Dr.Lee  If pt is not established with a provider, would they like to be referred to a provider to establish care? No .   Dental Screening: Recommended annual dental exams for proper oral hygiene  Community Resource Referral / Chronic Care Management: CRR required this visit?  No   CCM required this visit?  No      Plan:     I have personally reviewed and noted the following in the patient's chart:   Medical and social history Use of alcohol, tobacco or illicit drugs  Current medications and supplements including opioid prescriptions. Patient is not currently taking opioid prescriptions. Functional ability and status Nutritional status Physical activity Advanced directives List of other physicians Hospitalizations, surgeries, and ER visits in previous 12 months Vitals Screenings to include cognitive, depression, and falls Referrals and appointments  In addition, I have reviewed and discussed with patient certain preventive protocols, quality metrics, and best practice recommendations. A written personalized care plan for preventive services as well as general preventive health  recommendations were provided to patient.     Daphane Shepherd, LPN   [id number]   Nurse Notes: Patient declines any vaccines

## 2022-11-16 ENCOUNTER — Other Ambulatory Visit: Payer: Self-pay | Admitting: Family

## 2022-11-16 DIAGNOSIS — I10 Essential (primary) hypertension: Secondary | ICD-10-CM

## 2022-11-17 ENCOUNTER — Encounter: Payer: Self-pay | Admitting: Family

## 2022-11-17 ENCOUNTER — Ambulatory Visit (INDEPENDENT_AMBULATORY_CARE_PROVIDER_SITE_OTHER): Payer: Medicare HMO | Admitting: Family

## 2022-11-17 VITALS — BP 129/79 | HR 98 | Temp 97.0°F | Ht 62.0 in | Wt 132.0 lb

## 2022-11-17 DIAGNOSIS — E785 Hyperlipidemia, unspecified: Secondary | ICD-10-CM | POA: Diagnosis not present

## 2022-11-17 DIAGNOSIS — Z86718 Personal history of other venous thrombosis and embolism: Secondary | ICD-10-CM

## 2022-11-17 DIAGNOSIS — F03A0 Unspecified dementia, mild, without behavioral disturbance, psychotic disturbance, mood disturbance, and anxiety: Secondary | ICD-10-CM

## 2022-11-17 DIAGNOSIS — I10 Essential (primary) hypertension: Secondary | ICD-10-CM | POA: Diagnosis not present

## 2022-11-17 DIAGNOSIS — D329 Benign neoplasm of meninges, unspecified: Secondary | ICD-10-CM

## 2022-11-17 DIAGNOSIS — R69 Illness, unspecified: Secondary | ICD-10-CM | POA: Diagnosis not present

## 2022-11-17 DIAGNOSIS — Z85820 Personal history of malignant melanoma of skin: Secondary | ICD-10-CM | POA: Diagnosis not present

## 2022-11-17 NOTE — Patient Instructions (Signed)
Health Maintenance After Age 73 After age 73, you are at a higher risk for certain long-term diseases and infections as well as injuries from falls. Falls are a major cause of broken bones and head injuries in people who are older than age 73. Getting regular preventive care can help to keep you healthy and well. Preventive care includes getting regular testing and making lifestyle changes as recommended by your health care provider. Talk with your health care provider about: Which screenings and tests you should have. A screening is a test that checks for a disease when you have no symptoms. A diet and exercise plan that is right for you. What should I know about screenings and tests to prevent falls? Screening and testing are the best ways to find a health problem early. Early diagnosis and treatment give you the best chance of managing medical conditions that are common after age 73. Certain conditions and lifestyle choices may make you more likely to have a fall. Your health care provider may recommend: Regular vision checks. Poor vision and conditions such as cataracts can make you more likely to have a fall. If you wear glasses, make sure to get your prescription updated if your vision changes. Medicine review. Work with your health care provider to regularly review all of the medicines you are taking, including over-the-counter medicines. Ask your health care provider about any side effects that may make you more likely to have a fall. Tell your health care provider if any medicines that you take make you feel dizzy or sleepy. Strength and balance checks. Your health care provider may recommend certain tests to check your strength and balance while standing, walking, or changing positions. Foot health exam. Foot pain and numbness, as well as not wearing proper footwear, can make you more likely to have a fall. Screenings, including: Osteoporosis screening. Osteoporosis is a condition that causes  the bones to get weaker and break more easily. Blood pressure screening. Blood pressure changes and medicines to control blood pressure can make you feel dizzy. Depression screening. You may be more likely to have a fall if you have a fear of falling, feel depressed, or feel unable to do activities that you used to do. Alcohol use screening. Using too much alcohol can affect your balance and may make you more likely to have a fall. Follow these instructions at home: Lifestyle Do not drink alcohol if: Your health care provider tells you not to drink. If you drink alcohol: Limit how much you have to: 0-1 drink a day for women. 0-2 drinks a day for men. Know how much alcohol is in your drink. In the [place] one drink equals one 12 oz bottle of beer (355 mL), one 5 oz glass of wine (148 mL), or one 1 oz glass of hard liquor (44 mL). Do not use any products that contain nicotine or tobacco. These products include cigarettes, chewing tobacco, and vaping devices, such as e-cigarettes. If you need help quitting, ask your health care provider. Activity  Follow a regular exercise program to stay fit. This will help you maintain your balance. Ask your health care provider what types of exercise are appropriate for you. If you need a cane or walker, use it as recommended by your health care provider. Wear supportive shoes that have nonskid soles. Safety  Remove any tripping hazards, such as rugs, cords, and clutter. Install safety equipment such as grab bars in bathrooms and safety rails on stairs. Keep rooms and walkways   well-lit. General instructions Talk with your health care provider about your risks for falling. Tell your health care provider if: You fall. Be sure to tell your health care provider about all falls, even ones that seem minor. You feel dizzy, tiredness (fatigue), or off-balance. Take over-the-counter and prescription medicines only as told by your health care provider. These include  supplements. Eat a healthy diet and maintain a healthy weight. A healthy diet includes low-fat dairy products, low-fat (lean) meats, and fiber from whole grains, beans, and lots of fruits and vegetables. Stay current with your vaccines. Schedule regular health, dental, and eye exams. Summary Having a healthy lifestyle and getting preventive care can help to protect your health and wellness after age 73. Screening and testing are the best way to find a health problem early and help you avoid having a fall. Early diagnosis and treatment give you the best chance for managing medical conditions that are more common for people who are older than age 73. Falls are a major cause of broken bones and head injuries in people who are older than age 73. Take precautions to prevent a fall at home. Work with your health care provider to learn what changes you can make to improve your health and wellness and to prevent falls. This information is not intended to replace advice given to you by your health care provider. Make sure you discuss any questions you have with your health care provider. Document Revised: 12/16/2020 Document Reviewed: 12/16/2020 Elsevier Patient Education  2023 [employer]  

## 2022-11-17 NOTE — Progress Notes (Signed)
Subjective:    Patient ID: EFRATA GENTHER, female    DOB: 1950/11/05, 72 y.o.   MRN: [id number]  Chief Complaint  Patient presents with   Medical Management of Chronic Issues   Pt presents to the office today for CPE without pap.  She is followed by Neurosurgeon once a year for meningioma.     She has been diagnosed with dementia. She was started on Namenda and Aricpet on 07/06/22. States she will continue to forget what she is saying in middle of her sentence. Reports it might be slightly better and stopped these.    She has meningioma and had this removed on 12/04/16. Reports she was having similar memory problems before she had this removed and worried. She had MRI for 07/13/22 that, "No acute or reversible finding. Mild chronic small-vessel ischemic change of the cerebral hemispheric white matter."  She has osteoporosis and was prescribed Fosamax weekly. She stopped this. She is not taking calcium or vit D. Her last dexa scan was 12/10/20. Hypertension This is a chronic problem. The current episode started more than 1 year ago. The problem has been resolved since onset. The problem is controlled. Pertinent negatives include no malaise/fatigue, peripheral edema or shortness of breath. Risk factors for coronary artery disease include dyslipidemia. The current treatment provides moderate improvement.  Hyperlipidemia This is a chronic problem. The current episode started more than 1 year ago. The problem is controlled. Pertinent negatives include no shortness of breath. Current antihyperlipidemic treatment includes statins. The current treatment provides moderate improvement of lipids. Risk factors for coronary artery disease include dyslipidemia, hypertension, a sedentary lifestyle and post-menopausal.      Review of Systems  Constitutional:  Negative for malaise/fatigue.  Respiratory:  Negative for shortness of breath.   All other systems reviewed and are negative.      Objective:    Physical Exam Vitals reviewed.  Constitutional:      General: She is not in acute distress.    Appearance: She is well-developed.  HENT:     Head: Normocephalic and atraumatic.     Right Ear: Tympanic membrane normal.     Left Ear: Tympanic membrane normal.  Eyes:     Pupils: Pupils are equal, round, and reactive to light.  Neck:     Thyroid: No thyromegaly.  Cardiovascular:     Rate and Rhythm: Normal rate and regular rhythm.     Heart sounds: Normal heart sounds. No murmur heard. Pulmonary:     Effort: Pulmonary effort is normal. No respiratory distress.     Breath sounds: Normal breath sounds. No wheezing.  Abdominal:     General: Bowel sounds are normal. There is no distension.     Palpations: Abdomen is soft.     Tenderness: There is no abdominal tenderness.  Musculoskeletal:        General: No tenderness. Normal range of motion.     Cervical back: Normal range of motion and neck supple.  Skin:    General: Skin is warm and dry.  Neurological:     Mental Status: She is alert and oriented to person, place, and time.     Cranial Nerves: No cranial nerve deficit.     Deep Tendon Reflexes: Reflexes are normal and symmetric.  Psychiatric:        Behavior: Behavior normal.        Thought Content: Thought content normal.        Judgment: Judgment normal.  BP 129/79   Pulse 98   Temp (!) 97 F (36.1 C) (Temporal)   Ht 5\' 2"  (1.575 m)   Wt 132 lb (59.9 kg)   SpO2 98%   BMI 24.14 kg/m      Assessment & Plan:  ELLISYN TEUBERT comes in today with chief complaint of Medical Management of Chronic Issues   Diagnosis and orders addressed:  1. Hyperlipidemia, unspecified hyperlipidemia type  2. Primary hypertension  3. Meningioma  4. History of DVT (deep vein thrombosis)  5. History of melanoma  6. Mild dementia without behavioral disturbance, psychotic disturbance, mood disturbance, or anxiety, unspecified dementia type   Labs refused  Health Maintenance  reviewed Diet and exercise encouraged  Follow up plan: 3 months   Jannifer Rodney, FNP

## 2022-11-20 DIAGNOSIS — J0110 Acute frontal sinusitis, unspecified: Secondary | ICD-10-CM | POA: Diagnosis not present

## 2022-11-26 ENCOUNTER — Telehealth: Payer: Self-pay | Admitting: Family

## 2022-11-26 NOTE — Telephone Encounter (Signed)
Pt came in office wanting to know why she was scheduled to have her mammogram done in [place] at [facility] instead of with the mammogram bus that comes to [facility]?  Please advise and call patient.

## 2022-11-27 NOTE — Telephone Encounter (Signed)
Did she have an abnormal mammogram or is she complaining of breast pain or masses. Otherwise she can get on bus here.

## 2022-11-27 NOTE — Telephone Encounter (Signed)
Lmtcb.

## 2022-11-30 ENCOUNTER — Ambulatory Visit (HOSPITAL_COMMUNITY)
Admission: RE | Admit: 2022-11-30 | Discharge: 2022-11-30 | Disposition: A | Discharge status: Home / Self Care | Payer: Medicare HMO | Source: Ambulatory Visit | Attending: Family | Admitting: Family

## 2022-11-30 DIAGNOSIS — Z1231 Encounter for screening mammogram for malignant neoplasm of breast: Secondary | ICD-10-CM | POA: Diagnosis not present

## 2023-01-01 ENCOUNTER — Other Ambulatory Visit: Payer: Self-pay | Admitting: Family

## 2023-01-01 DIAGNOSIS — F03A0 Unspecified dementia, mild, without behavioral disturbance, psychotic disturbance, mood disturbance, and anxiety: Secondary | ICD-10-CM

## 2023-01-01 DIAGNOSIS — R413 Other amnesia: Secondary | ICD-10-CM

## 2023-01-06 ENCOUNTER — Other Ambulatory Visit: Payer: Self-pay

## 2023-01-06 DIAGNOSIS — I10 Essential (primary) hypertension: Secondary | ICD-10-CM

## 2023-01-06 DIAGNOSIS — E785 Hyperlipidemia, unspecified: Secondary | ICD-10-CM

## 2023-01-06 DIAGNOSIS — M810 Age-related osteoporosis without current pathological fracture: Secondary | ICD-10-CM

## 2023-01-06 DIAGNOSIS — F03A0 Unspecified dementia, mild, without behavioral disturbance, psychotic disturbance, mood disturbance, and anxiety: Secondary | ICD-10-CM

## 2023-01-06 MED ORDER — ALENDRONATE SODIUM 70 MG PO TABS: ORAL_TABLET | ORAL | 2 refills | Status: DC

## 2023-01-06 MED ORDER — DONEPEZIL HCL 10 MG PO TABS: 10.0000 mg | ORAL_TABLET | Freq: Every day | ORAL | 1 refills | Status: DC

## 2023-01-06 MED ORDER — MEMANTINE HCL 10 MG PO TABS
10.0000 mg | ORAL_TABLET | Freq: Two times a day (BID) | ORAL | 1 refills | Status: DC
Start: 2023-01-06 — End: 2023-01-11

## 2023-01-06 MED ORDER — ROSUVASTATIN CALCIUM 10 MG PO TABS
10.0000 mg | ORAL_TABLET | Freq: Every day | ORAL | 1 refills | Status: DC
Start: 2023-01-06 — End: 2023-01-11

## 2023-01-06 MED ORDER — AMLODIPINE BESYLATE 10 MG PO TABS: 10.0000 mg | ORAL_TABLET | Freq: Every day | ORAL | 0 refills | Status: DC

## 2023-01-06 MED ORDER — LOSARTAN POTASSIUM 100 MG PO TABS: 100.0000 mg | ORAL_TABLET | Freq: Every day | ORAL | 0 refills | Status: DC

## 2023-01-11 ENCOUNTER — Ambulatory Visit (INDEPENDENT_AMBULATORY_CARE_PROVIDER_SITE_OTHER): Payer: Medicare HMO | Admitting: Family

## 2023-01-11 ENCOUNTER — Encounter: Payer: Self-pay | Admitting: Family

## 2023-01-11 VITALS — BP 155/64 | HR 72 | Temp 97.7°F | Resp 20 | Ht 62.0 in | Wt 131.0 lb

## 2023-01-11 DIAGNOSIS — I10 Essential (primary) hypertension: Secondary | ICD-10-CM

## 2023-01-11 DIAGNOSIS — F03A0 Unspecified dementia, mild, without behavioral disturbance, psychotic disturbance, mood disturbance, and anxiety: Secondary | ICD-10-CM

## 2023-01-11 DIAGNOSIS — Z91199 Patient's noncompliance with other medical treatment and regimen due to unspecified reason: Secondary | ICD-10-CM | POA: Diagnosis not present

## 2023-01-11 DIAGNOSIS — E785 Hyperlipidemia, unspecified: Secondary | ICD-10-CM | POA: Diagnosis not present

## 2023-01-11 DIAGNOSIS — M810 Age-related osteoporosis without current pathological fracture: Secondary | ICD-10-CM | POA: Diagnosis not present

## 2023-01-11 DIAGNOSIS — D329 Benign neoplasm of meninges, unspecified: Secondary | ICD-10-CM

## 2023-01-11 MED ORDER — AMLODIPINE BESYLATE 10 MG PO TABS
10.0000 mg | ORAL_TABLET | Freq: Every day | ORAL | 0 refills | Status: DC
Start: 2023-01-11 — End: 2023-09-13

## 2023-01-11 MED ORDER — ROSUVASTATIN CALCIUM 10 MG PO TABS
10.0000 mg | ORAL_TABLET | Freq: Every day | ORAL | 1 refills | Status: DC
Start: 2023-01-11 — End: 2024-02-14

## 2023-01-11 MED ORDER — ALENDRONATE SODIUM 70 MG PO TABS: ORAL_TABLET | ORAL | 2 refills | Status: DC

## 2023-01-11 MED ORDER — DONEPEZIL HCL 10 MG PO TABS
10.0000 mg | ORAL_TABLET | Freq: Every day | ORAL | 1 refills | Status: DC
Start: 2023-01-11 — End: 2023-03-25

## 2023-01-11 MED ORDER — MEMANTINE HCL 10 MG PO TABS
10.0000 mg | ORAL_TABLET | Freq: Two times a day (BID) | ORAL | 1 refills | Status: DC
Start: 2023-01-11 — End: 2023-03-25

## 2023-01-11 MED ORDER — LOSARTAN POTASSIUM 100 MG PO TABS: 100.0000 mg | ORAL_TABLET | Freq: Every day | ORAL | 0 refills | Status: DC

## 2023-01-11 NOTE — Patient Instructions (Signed)
Hypertension, Adult High blood pressure (hypertension) is when the force of blood pumping through the arteries is too strong. The arteries are the blood vessels that carry blood from the heart throughout the body. Hypertension forces the heart to work harder to pump blood and may cause arteries to become narrow or stiff. Untreated or uncontrolled hypertension can lead to a heart attack, heart failure, a stroke, kidney disease, and other problems. A blood pressure reading consists of a higher number over a lower number. Ideally, your blood pressure should be below 120/80. The first ("top") number is called the systolic pressure. It is a measure of the pressure in your arteries as your heart beats. The second ("bottom") number is called the diastolic pressure. It is a measure of the pressure in your arteries as the heart relaxes. What are the causes? The exact cause of this condition is not known. There are some conditions that result in high blood pressure. What increases the risk? Certain factors may make you more likely to develop high blood pressure. Some of these risk factors are under your control, including: Smoking. Not getting enough exercise or physical activity. Being overweight. Having too much fat, sugar, calories, or salt (sodium) in your diet. Drinking too much alcohol. Other risk factors include: Having a personal history of heart disease, diabetes, high cholesterol, or kidney disease. Stress. Having a family history of high blood pressure and high cholesterol. Having obstructive sleep apnea. Age. The risk increases with age. What are the signs or symptoms? High blood pressure may not cause symptoms. Very high blood pressure (hypertensive crisis) may cause: Headache. Fast or irregular heartbeats (palpitations). Shortness of breath. Nosebleed. Nausea and vomiting. Vision changes. Severe chest pain, dizziness, and seizures. How is this diagnosed? This condition is diagnosed by  measuring your blood pressure while you are seated, with your arm resting on a flat surface, your legs uncrossed, and your feet flat on the floor. The cuff of the blood pressure monitor will be placed directly against the skin of your upper arm at the level of your heart. Blood pressure should be measured at least twice using the same arm. Certain conditions can cause a difference in blood pressure between your right and left arms. If you have a high blood pressure reading during one visit or you have normal blood pressure with other risk factors, you may be asked to: Return on a different day to have your blood pressure checked again. Monitor your blood pressure at home for 1 week or longer. If you are diagnosed with hypertension, you may have other blood or imaging tests to help your health care provider understand your overall risk for other conditions. How is this treated? This condition is treated by making healthy lifestyle changes, such as eating healthy foods, exercising more, and reducing your alcohol intake. You may be referred for counseling on a healthy diet and physical activity. Your health care provider may prescribe medicine if lifestyle changes are not enough to get your blood pressure under control and if: Your systolic blood pressure is above 130. Your diastolic blood pressure is above 80. Your personal target blood pressure may vary depending on your medical conditions, your age, and other factors. Follow these instructions at home: Eating and drinking  Eat a diet that is high in fiber and potassium, and low in sodium, added sugar, and fat. An example of this eating plan is called the DASH diet. DASH stands for Dietary Approaches to Stop Hypertension. To eat this way: Eat   plenty of fresh fruits and vegetables. Try to fill one half of your plate at each meal with fruits and vegetables. Eat whole grains, such as whole-wheat pasta, brown rice, or whole-grain bread. Fill about one  fourth of your plate with whole grains. Eat or drink low-fat dairy products, such as skim milk or low-fat yogurt. Avoid fatty cuts of meat, processed or cured meats, and poultry with skin. Fill about one fourth of your plate with lean proteins, such as fish, chicken without skin, beans, eggs, or tofu. Avoid pre-made and processed foods. These tend to be higher in sodium, added sugar, and fat. Reduce your daily sodium intake. Many people with hypertension should eat less than 1,500 mg of sodium a day. Do not drink alcohol if: Your health care provider tells you not to drink. You are pregnant, may be pregnant, or are planning to become pregnant. If you drink alcohol: Limit how much you have to: 0-1 drink a day for women. 0-2 drinks a day for men. Know how much alcohol is in your drink. In the [place] one drink equals one 12 oz bottle of beer (355 mL), one 5 oz glass of wine (148 mL), or one 1 oz glass of hard liquor (44 mL). Lifestyle  Work with your health care provider to maintain a healthy body weight or to lose weight. Ask what an ideal weight is for you. Get at least 30 minutes of exercise that causes your heart to beat faster (aerobic exercise) most days of the week. Activities may include walking, swimming, or biking. Include exercise to strengthen your muscles (resistance exercise), such as Pilates or lifting weights, as part of your weekly exercise routine. Try to do these types of exercises for 30 minutes at least 3 days a week. Do not use any products that contain nicotine or tobacco. These products include cigarettes, chewing tobacco, and vaping devices, such as e-cigarettes. If you need help quitting, ask your health care provider. Monitor your blood pressure at home as told by your health care provider. Keep all follow-up visits. This is important. Medicines Take over-the-counter and prescription medicines only as told by your health care provider. Follow directions carefully. Blood  pressure medicines must be taken as prescribed. Do not skip doses of blood pressure medicine. Doing this puts you at risk for problems and can make the medicine less effective. Ask your health care provider about side effects or reactions to medicines that you should watch for. Contact a health care provider if you: Think you are having a reaction to a medicine you are taking. Have headaches that keep coming back (recurring). Feel dizzy. Have swelling in your ankles. Have trouble with your vision. Get help right away if you: Develop a severe headache or confusion. Have unusual weakness or numbness. Feel faint. Have severe pain in your chest or abdomen. Vomit repeatedly. Have trouble breathing. These symptoms may be an emergency. Get help right away. Call 911. Do not wait to see if the symptoms will go away. Do not drive yourself to the hospital. Summary Hypertension is when the force of blood pumping through your arteries is too strong. If this condition is not controlled, it may put you at risk for serious complications. Your personal target blood pressure may vary depending on your medical conditions, your age, and other factors. For most people, a normal blood pressure is less than 120/80. Hypertension is treated with lifestyle changes, medicines, or a combination of both. Lifestyle changes include losing weight, eating a healthy,   low-sodium diet, exercising more, and limiting alcohol. This information is not intended to replace advice given to you by your health care provider. Make sure you discuss any questions you have with your health care provider. Document Revised: 06/03/2021 Document Reviewed: 06/03/2021 Elsevier Patient Education  2024 Elsevier Inc.  

## 2023-01-11 NOTE — Progress Notes (Signed)
Subjective:    Patient ID: Kristina Whitaker, female    DOB: [birth year], 72 y.o.   MRN: [id number]  Chief Complaint  Patient presents with   Medical Management of Chronic Issues   Pt presents to the office today for chronic follow up. She is currently not taking any medications at this time because confusion at her pharmacy.  She has been diagnosed with dementia. She was started on Namenda and Aricpet on 07/06/22. States she will continue to forget what she is saying in middle of her sentence. However, she has stopped these at this time.     She is followed by Neurosurgeon once a year for meningioma.   She has meningioma and had this removed on 12/04/16. Reports she was having similar memory problems before she had this removed and worried. She had MRI for 07/13/22 that, "No acute or reversible finding. Mild chronic small-vessel ischemic change of the cerebral hemispheric white matter."   She has osteoporosis and was prescribed Fosamax weekly. She stopped this. She is not taking calcium or vit D. Her last dexa scan was 12/10/20. Hypertension This is a chronic problem. The current episode started more than 1 year ago. The problem is unchanged. The problem is uncontrolled. Associated symptoms include malaise/fatigue and peripheral edema. Pertinent negatives include no shortness of breath. Risk factors for coronary artery disease include dyslipidemia and sedentary lifestyle. Past treatments include nothing. The current treatment provides no improvement.  Hyperlipidemia This is a chronic problem. The current episode started more than 1 year ago. Exacerbating diseases include obesity. Pertinent negatives include no shortness of breath. The current treatment provides mild improvement of lipids. Risk factors for coronary artery disease include dyslipidemia, hypertension and a sedentary lifestyle.      Review of Systems  Constitutional:  Positive for malaise/fatigue.  Respiratory:  Negative for  shortness of breath.   All other systems reviewed and are negative.      Objective:   Physical Exam Vitals reviewed.  Constitutional:      General: She is not in acute distress.    Appearance: She is well-developed.  HENT:     Head: Normocephalic and atraumatic.     Right Ear: Tympanic membrane normal.     Left Ear: Tympanic membrane normal.  Eyes:     Pupils: Pupils are equal, round, and reactive to light.  Neck:     Thyroid: No thyromegaly.  Cardiovascular:     Rate and Rhythm: Normal rate and regular rhythm.     Heart sounds: Normal heart sounds. No murmur heard. Pulmonary:     Effort: Pulmonary effort is normal. No respiratory distress.     Breath sounds: Normal breath sounds. No wheezing.  Abdominal:     General: Bowel sounds are normal. There is no distension.     Palpations: Abdomen is soft.     Tenderness: There is no abdominal tenderness.  Musculoskeletal:        General: No tenderness. Normal range of motion.     Cervical back: Normal range of motion and neck supple.  Skin:    General: Skin is warm and dry.  Neurological:     Mental Status: She is alert. She is disoriented and confused.     Cranial Nerves: No cranial nerve deficit.     Deep Tendon Reflexes: Reflexes are normal and symmetric.  Psychiatric:        Behavior: Behavior normal.        Thought Content: Thought content normal.  Judgment: Judgment normal.       BP (!) 155/64   Pulse 72   Temp 97.7 F (36.5 C) (Temporal)   Resp 20   Ht 5\' 2"  (1.575 m)   Wt 131 lb (59.4 kg)   SpO2 97%   BMI 23.96 kg/m      Assessment & Plan:  ARRINGTON KREBSBACH comes in today with chief complaint of Medical Management of Chronic Issues   Diagnosis and orders addressed:  1. Mild dementia without behavioral disturbance, psychotic disturbance, mood disturbance, or anxiety, unspecified dementia type (HCC) - CMP14+EGFR - CBC with Differential/Platelet - donepezil (ARICEPT) 10 MG tablet; Take 1 tablet (10  mg total) by mouth at bedtime.  Dispense: 90 tablet; Refill: 1 - memantine (NAMENDA) 10 MG tablet; Take 1 tablet (10 mg total) by mouth 2 (two) times daily.  Dispense: 180 tablet; Refill: 1  2. Hyperlipidemia, unspecified hyperlipidemia type - CMP14+EGFR - CBC with Differential/Platelet - Lipid panel - rosuvastatin (CRESTOR) 10 MG tablet; Take 1 tablet (10 mg total) by mouth daily.  Dispense: 90 tablet; Refill: 1  3. Primary hypertension - CMP14+EGFR - CBC with Differential/Platelet - amLODipine (NORVASC) 10 MG tablet; Take 1 tablet (10 mg total) by mouth daily.  Dispense: 90 tablet; Refill: 0 - losartan (COZAAR) 100 MG tablet; Take 1 tablet (100 mg total) by mouth daily.  Dispense: 90 tablet; Refill: 0  4. Meningioma (HCC) - CMP14+EGFR - CBC with Differential/Platelet  5. Osteoporosis, unspecified osteoporosis type, unspecified pathological fracture presence - CMP14+EGFR - CBC with Differential/Platelet - alendronate (FOSAMAX) 70 MG tablet; TAKE 1 TABLET ON AN EMPTY STOMACH WITH A FULL GLASS OF WATER EVERY 7 DAYS  Dispense: 12 tablet; Refill: 2  6. Noncompliance  PT refuses all labs today She is very confused today and I am unable to effectively treat her. She has told me she has not been taking any of her medications, then has told me she took 5 pills this morning. I have attempted to call her husband but did not answer. Attempted to get husbands cell phone number from her cell phone, but could not find it. States she may have deleted it by accident. Pt is going home and will tell husband to call me and I wrote on her AVS to please have husband call me.  Strongly recommend husband start coming to appointments. I know patient refuses for him to come with her, however, at this time I believe it is unsafe for her to be driving   I will wait to adjust medications until after I speak with husband.  I have refilled all of her medications to [facility], they do offer pill packs which  would be an ideal for her.  Health Maintenance reviewed Diet and exercise encouraged  Follow up plan: 1 month  Jannifer Rodney, FNP

## 2023-01-18 ENCOUNTER — Telehealth: Payer: Self-pay | Admitting: Family

## 2023-01-18 NOTE — Telephone Encounter (Signed)
These are for memory. She does not have to take if causing nausea.

## 2023-01-18 NOTE — Telephone Encounter (Signed)
Patient started donepezil (ARICEPT) 10 MG tablet and memantine (NAMENDA) 10 MG tablet on 6/9 and said that it made her throw up. She denied any other sxs but wants to know if there is anything else she can try to take for this. Please call back and advise.

## 2023-01-18 NOTE — Telephone Encounter (Signed)
LMTCB

## 2023-01-27 NOTE — Telephone Encounter (Signed)
Patient walked in and spoke to triage nurse about this

## 2023-01-29 ENCOUNTER — Ambulatory Visit (INDEPENDENT_AMBULATORY_CARE_PROVIDER_SITE_OTHER): Payer: Medicare HMO | Admitting: Family

## 2023-01-29 ENCOUNTER — Encounter: Payer: Self-pay | Admitting: Family

## 2023-01-29 VITALS — BP 143/61 | HR 68 | Temp 97.5°F | Ht 62.0 in | Wt 126.0 lb

## 2023-01-29 DIAGNOSIS — F03A0 Unspecified dementia, mild, without behavioral disturbance, psychotic disturbance, mood disturbance, and anxiety: Secondary | ICD-10-CM

## 2023-01-29 DIAGNOSIS — E785 Hyperlipidemia, unspecified: Secondary | ICD-10-CM | POA: Diagnosis not present

## 2023-01-29 DIAGNOSIS — M810 Age-related osteoporosis without current pathological fracture: Secondary | ICD-10-CM

## 2023-01-29 DIAGNOSIS — I10 Essential (primary) hypertension: Secondary | ICD-10-CM | POA: Diagnosis not present

## 2023-01-29 DIAGNOSIS — D329 Benign neoplasm of meninges, unspecified: Secondary | ICD-10-CM | POA: Diagnosis not present

## 2023-01-29 NOTE — Patient Instructions (Signed)
Hypertension, Adult High blood pressure (hypertension) is when the force of blood pumping through the arteries is too strong. The arteries are the blood vessels that carry blood from the heart throughout the body. Hypertension forces the heart to work harder to pump blood and may cause arteries to become narrow or stiff. Untreated or uncontrolled hypertension can lead to a heart attack, heart failure, a stroke, kidney disease, and other problems. A blood pressure reading consists of a higher number over a lower number. Ideally, your blood pressure should be below 120/80. The first ("top") number is called the systolic pressure. It is a measure of the pressure in your arteries as your heart beats. The second ("bottom") number is called the diastolic pressure. It is a measure of the pressure in your arteries as the heart relaxes. What are the causes? The exact cause of this condition is not known. There are some conditions that result in high blood pressure. What increases the risk? Certain factors may make you more likely to develop high blood pressure. Some of these risk factors are under your control, including: Smoking. Not getting enough exercise or physical activity. Being overweight. Having too much fat, sugar, calories, or salt (sodium) in your diet. Drinking too much alcohol. Other risk factors include: Having a personal history of heart disease, diabetes, high cholesterol, or kidney disease. Stress. Having a family history of high blood pressure and high cholesterol. Having obstructive sleep apnea. Age. The risk increases with age. What are the signs or symptoms? High blood pressure may not cause symptoms. Very high blood pressure (hypertensive crisis) may cause: Headache. Fast or irregular heartbeats (palpitations). Shortness of breath. Nosebleed. Nausea and vomiting. Vision changes. Severe chest pain, dizziness, and seizures. How is this diagnosed? This condition is diagnosed by  measuring your blood pressure while you are seated, with your arm resting on a flat surface, your legs uncrossed, and your feet flat on the floor. The cuff of the blood pressure monitor will be placed directly against the skin of your upper arm at the level of your heart. Blood pressure should be measured at least twice using the same arm. Certain conditions can cause a difference in blood pressure between your right and left arms. If you have a high blood pressure reading during one visit or you have normal blood pressure with other risk factors, you may be asked to: Return on a different day to have your blood pressure checked again. Monitor your blood pressure at home for 1 week or longer. If you are diagnosed with hypertension, you may have other blood or imaging tests to help your health care provider understand your overall risk for other conditions. How is this treated? This condition is treated by making healthy lifestyle changes, such as eating healthy foods, exercising more, and reducing your alcohol intake. You may be referred for counseling on a healthy diet and physical activity. Your health care provider may prescribe medicine if lifestyle changes are not enough to get your blood pressure under control and if: Your systolic blood pressure is above 130. Your diastolic blood pressure is above 80. Your personal target blood pressure may vary depending on your medical conditions, your age, and other factors. Follow these instructions at home: Eating and drinking  Eat a diet that is high in fiber and potassium, and low in sodium, added sugar, and fat. An example of this eating plan is called the DASH diet. DASH stands for Dietary Approaches to Stop Hypertension. To eat this way: Eat   plenty of fresh fruits and vegetables. Try to fill one half of your plate at each meal with fruits and vegetables. Eat whole grains, such as whole-wheat pasta, brown rice, or whole-grain bread. Fill about one  fourth of your plate with whole grains. Eat or drink low-fat dairy products, such as skim milk or low-fat yogurt. Avoid fatty cuts of meat, processed or cured meats, and poultry with skin. Fill about one fourth of your plate with lean proteins, such as fish, chicken without skin, beans, eggs, or tofu. Avoid pre-made and processed foods. These tend to be higher in sodium, added sugar, and fat. Reduce your daily sodium intake. Many people with hypertension should eat less than 1,500 mg of sodium a day. Do not drink alcohol if: Your health care provider tells you not to drink. You are pregnant, may be pregnant, or are planning to become pregnant. If you drink alcohol: Limit how much you have to: 0-1 drink a day for women. 0-2 drinks a day for men. Know how much alcohol is in your drink. In the [place] one drink equals one 12 oz bottle of beer (355 mL), one 5 oz glass of wine (148 mL), or one 1 oz glass of hard liquor (44 mL). Lifestyle  Work with your health care provider to maintain a healthy body weight or to lose weight. Ask what an ideal weight is for you. Get at least 30 minutes of exercise that causes your heart to beat faster (aerobic exercise) most days of the week. Activities may include walking, swimming, or biking. Include exercise to strengthen your muscles (resistance exercise), such as Pilates or lifting weights, as part of your weekly exercise routine. Try to do these types of exercises for 30 minutes at least 3 days a week. Do not use any products that contain nicotine or tobacco. These products include cigarettes, chewing tobacco, and vaping devices, such as e-cigarettes. If you need help quitting, ask your health care provider. Monitor your blood pressure at home as told by your health care provider. Keep all follow-up visits. This is important. Medicines Take over-the-counter and prescription medicines only as told by your health care provider. Follow directions carefully. Blood  pressure medicines must be taken as prescribed. Do not skip doses of blood pressure medicine. Doing this puts you at risk for problems and can make the medicine less effective. Ask your health care provider about side effects or reactions to medicines that you should watch for. Contact a health care provider if you: Think you are having a reaction to a medicine you are taking. Have headaches that keep coming back (recurring). Feel dizzy. Have swelling in your ankles. Have trouble with your vision. Get help right away if you: Develop a severe headache or confusion. Have unusual weakness or numbness. Feel faint. Have severe pain in your chest or abdomen. Vomit repeatedly. Have trouble breathing. These symptoms may be an emergency. Get help right away. Call 911. Do not wait to see if the symptoms will go away. Do not drive yourself to the hospital. Summary Hypertension is when the force of blood pumping through your arteries is too strong. If this condition is not controlled, it may put you at risk for serious complications. Your personal target blood pressure may vary depending on your medical conditions, your age, and other factors. For most people, a normal blood pressure is less than 120/80. Hypertension is treated with lifestyle changes, medicines, or a combination of both. Lifestyle changes include losing weight, eating a healthy,   low-sodium diet, exercising more, and limiting alcohol. This information is not intended to replace advice given to you by your health care provider. Make sure you discuss any questions you have with your health care provider. Document Revised: 06/03/2021 Document Reviewed: 06/03/2021 Elsevier Patient Education  2024 Elsevier Inc.  

## 2023-01-29 NOTE — Progress Notes (Signed)
Subjective:    Patient ID: Kristina Whitaker, female    DOB: 03-14-1951, 72 y.o.   MRN: [id number]  Chief Complaint  Patient presents with   Medical Management of Chronic Issues    Discuss medications.   Pt presents to the office today for chronic follow up. She is confused today and showed up at our office at 6 AM because she got multiple texts about her MyChart yesterday and today and was worried. These texts were to remind her of her appointment at [facility]. She reports she is now taking her medications, but is unsure which ones. Her husband fixes her medications in a pill box.     She is followed by Neurosurgeon once a year for meningioma.    She has meningioma and had this removed on 12/04/16. Reports she was having similar memory problems before she had this removed and worried. She had MRI for 07/13/22 that, "No acute or reversible finding. Mild chronic small-vessel ischemic change of the cerebral hemispheric white matter."   She has osteoporosis and was prescribed Fosamax weekly. She stopped this. She is not taking calcium or vit D. Her last dexa scan was 12/10/20. Hypertension This is a chronic problem. The current episode started more than 1 year ago. The problem is uncontrolled. Associated symptoms include malaise/fatigue. Pertinent negatives include no peripheral edema or shortness of breath. Risk factors for coronary artery disease include dyslipidemia and sedentary lifestyle. The current treatment provides moderate improvement.  Hyperlipidemia This is a chronic problem. The current episode started more than 1 year ago. Exacerbating diseases include obesity. Pertinent negatives include no shortness of breath. Current antihyperlipidemic treatment includes statins. The current treatment provides moderate improvement of lipids. Risk factors for coronary artery disease include dyslipidemia, hypertension and a sedentary lifestyle.      Review of Systems  Constitutional:  Positive for  malaise/fatigue.  Respiratory:  Negative for shortness of breath.   All other systems reviewed and are negative.      Objective:   Physical Exam Vitals reviewed.  Constitutional:      General: She is not in acute distress.    Appearance: She is well-developed.  HENT:     Head: Normocephalic and atraumatic.     Right Ear: Tympanic membrane normal.     Left Ear: Tympanic membrane normal.  Eyes:     Pupils: Pupils are equal, round, and reactive to light.  Neck:     Thyroid: No thyromegaly.  Cardiovascular:     Rate and Rhythm: Normal rate and regular rhythm.     Heart sounds: Normal heart sounds. No murmur heard. Pulmonary:     Effort: Pulmonary effort is normal. No respiratory distress.     Breath sounds: Normal breath sounds. No wheezing.  Abdominal:     General: Bowel sounds are normal. There is no distension.     Palpations: Abdomen is soft.     Tenderness: There is no abdominal tenderness.  Musculoskeletal:        General: No tenderness. Normal range of motion.     Cervical back: Normal range of motion and neck supple.  Skin:    General: Skin is warm and dry.  Neurological:     Mental Status: She is alert. Mental status is at baseline. She is disoriented.     Cranial Nerves: No cranial nerve deficit.     Deep Tendon Reflexes: Reflexes are normal and symmetric.  Psychiatric:        Behavior: Behavior normal.  Thought Content: Thought content normal.        Judgment: Judgment normal.       BP (!) 143/61   Pulse 68   Temp (!) 97.5 F (36.4 C) (Temporal)   Ht 5\' 2"  (1.575 m)   Wt 126 lb (57.2 kg)   SpO2 99%   BMI 23.05 kg/m      Assessment & Plan:   Kristina Whitaker comes in today with chief complaint of Medical Management of Chronic Issues (Discuss medications.)   Diagnosis and orders addressed:  1. Mild dementia without behavioral disturbance, psychotic disturbance, mood disturbance, or anxiety, unspecified dementia type (HCC)  2. Hyperlipidemia,  unspecified hyperlipidemia type  3. Primary hypertension  4. Osteoporosis, unspecified osteoporosis type, unspecified pathological fracture presence  5. Meningioma [facility])   Labs refused  Continue medications, husband will call back and speak to nurse what medications she is taking.  Health Maintenance refused Diet and exercise encouraged  Follow up plan: 1 month    Jannifer Rodney, FNP

## 2023-02-16 ENCOUNTER — Ambulatory Visit (INDEPENDENT_AMBULATORY_CARE_PROVIDER_SITE_OTHER): Payer: Medicare HMO

## 2023-02-16 ENCOUNTER — Ambulatory Visit (INDEPENDENT_AMBULATORY_CARE_PROVIDER_SITE_OTHER): Payer: Medicare HMO | Admitting: Family

## 2023-02-16 ENCOUNTER — Encounter: Payer: Self-pay | Admitting: Family

## 2023-02-16 VITALS — BP 149/70 | HR 71 | Temp 97.8°F | Ht 62.0 in | Wt 123.6 lb

## 2023-02-16 DIAGNOSIS — Z85820 Personal history of malignant melanoma of skin: Secondary | ICD-10-CM

## 2023-02-16 DIAGNOSIS — D329 Benign neoplasm of meninges, unspecified: Secondary | ICD-10-CM | POA: Diagnosis not present

## 2023-02-16 DIAGNOSIS — M810 Age-related osteoporosis without current pathological fracture: Secondary | ICD-10-CM | POA: Diagnosis not present

## 2023-02-16 DIAGNOSIS — I10 Essential (primary) hypertension: Secondary | ICD-10-CM

## 2023-02-16 DIAGNOSIS — E785 Hyperlipidemia, unspecified: Secondary | ICD-10-CM

## 2023-02-16 DIAGNOSIS — F03A0 Unspecified dementia, mild, without behavioral disturbance, psychotic disturbance, mood disturbance, and anxiety: Secondary | ICD-10-CM

## 2023-02-16 NOTE — Patient Instructions (Signed)

## 2023-02-16 NOTE — Progress Notes (Signed)
Subjective:    Patient ID: Kristina Whitaker, female    DOB: 04-20-1951, 72 y.o.   MRN: [id number]  Chief Complaint  Patient presents with   Medical Management of Chronic Issues    Pt presents to the office today for chronic follow up. She reports she is now taking her medications, but is unsure which ones. Her husband fixes her medications in a pill box.     She is followed by Neurosurgeon once a year for meningioma.    She has meningioma and had this removed on 12/04/16. Reports she was having similar memory problems before she had this removed and worried. She had MRI for 07/13/22 that, "No acute or reversible finding. Mild chronic small-vessel ischemic change of the cerebral hemispheric white matter."   She has osteoporosis and was prescribed Fosamax weekly.  She is not taking calcium or vit D. Her last dexa scan was 12/10/20.  She continues to refuse all labs. Has not had labs drawn since 2022.  Hypertension This is a chronic problem. The current episode started more than 1 year ago. The problem has been waxing and waning since onset. Associated symptoms include malaise/fatigue. Pertinent negatives include no peripheral edema or shortness of breath. Risk factors for coronary artery disease include sedentary lifestyle, obesity and dyslipidemia. Past treatments include angiotensin blockers. The current treatment provides moderate improvement.  Hyperlipidemia This is a chronic problem. The current episode started more than 1 year ago. The problem is uncontrolled. Pertinent negatives include no shortness of breath. Current antihyperlipidemic treatment includes statins. The current treatment provides moderate improvement of lipids. Risk factors for coronary artery disease include dyslipidemia, hypertension and a sedentary lifestyle.      Review of Systems  Constitutional:  Positive for malaise/fatigue.  Respiratory:  Negative for shortness of breath.   All other systems reviewed and are  negative.      Objective:   Physical Exam Vitals reviewed.  Constitutional:      General: She is not in acute distress.    Appearance: She is well-developed.  HENT:     Head: Normocephalic and atraumatic.  Eyes:     Pupils: Pupils are equal, round, and reactive to light.  Neck:     Thyroid: No thyromegaly.  Cardiovascular:     Rate and Rhythm: Normal rate and regular rhythm.     Heart sounds: Normal heart sounds. No murmur heard. Pulmonary:     Effort: Pulmonary effort is normal. No respiratory distress.     Breath sounds: Normal breath sounds. No wheezing.  Abdominal:     General: Bowel sounds are normal. There is no distension.     Palpations: Abdomen is soft.     Tenderness: There is no abdominal tenderness.  Musculoskeletal:        General: No tenderness. Normal range of motion.     Cervical back: Normal range of motion and neck supple.  Skin:    General: Skin is warm and dry.  Neurological:     Mental Status: She is alert and oriented to person, place, and time.     Cranial Nerves: No cranial nerve deficit.     Deep Tendon Reflexes: Reflexes are normal and symmetric.  Psychiatric:        Behavior: Behavior normal.        Thought Content: Thought content normal.        Judgment: Judgment normal.     BP (!) 149/70   Pulse 71   Temp 97.8 F (36.6  C) (Temporal)   Ht 5\' 2"  (1.575 m)   Wt 123 lb 9.6 oz (56.1 kg)   SpO2 99%   BMI 22.61 kg/m        Assessment & Plan:  ERSIE KUNTZ comes in today with chief complaint of Medical Management of Chronic Issues   Diagnosis and orders addressed:  1. Mild dementia without behavioral disturbance, psychotic disturbance, mood disturbance, or anxiety, unspecified dementia type (HCC)  2. History of melanoma  3. Hyperlipidemia, unspecified hyperlipidemia type  4. Primary hypertension  5. Meningioma (HCC)  6. Osteoporosis, unspecified osteoporosis type, unspecified pathological fracture presence - DG [facility]  DEXA   Labs refused  Continue current medications  Health Maintenance reviewed Diet and exercise encouraged  Follow up plan: 4 months    Jannifer Rodney, FNP

## 2023-02-18 DIAGNOSIS — Z780 Asymptomatic menopausal state: Secondary | ICD-10-CM | POA: Diagnosis not present

## 2023-02-18 DIAGNOSIS — M810 Age-related osteoporosis without current pathological fracture: Secondary | ICD-10-CM | POA: Diagnosis not present

## 2023-03-08 ENCOUNTER — Telehealth: Payer: Self-pay | Admitting: Family

## 2023-03-08 NOTE — Telephone Encounter (Signed)
BP medications can cause dizziness, but only if you are having hypotension. Every visit her BP is elevated so I do not believe this is the cause.   It could be her memory medication. She can hold her Aricept and Namenda for a week and see if dizziness improves.   Jannifer Rodney, FNP

## 2023-03-08 NOTE — Telephone Encounter (Signed)
Called and spoke with patient she is requesting that Neysa Bonito go through all her meds and see if there is any interactions. Patient states when she was driving the other day she got dizzy. Patient wants to make Neysa Bonito does not think that her medication could be causing this. Should any of her doses be lowered she rally wants her memory medication dosage checked. Please advise.

## 2023-03-08 NOTE — Telephone Encounter (Signed)
Patient aware and verbalized understanding. °

## 2023-03-12 ENCOUNTER — Telehealth: Payer: Self-pay | Admitting: Family Medicine

## 2023-03-12 NOTE — Telephone Encounter (Signed)
Ok, she needs to restart her BP medications if >140/90.

## 2023-03-12 NOTE — Telephone Encounter (Signed)
Patient aware and verbalized understanding. °

## 2023-03-15 ENCOUNTER — Telehealth: Payer: Self-pay | Admitting: Family

## 2023-03-15 NOTE — Telephone Encounter (Signed)
Spoke with patient - states she can see & walk.  States when she takes medication it makes her dizzy.  Her husband manages her medications and is not home but once he is home he Is going to call back with name of medication.  When he calls ask medication name and also ask if patient is still taking medication?

## 2023-03-22 NOTE — Telephone Encounter (Signed)
Left messge to call back made appt 08/15 at 9:40.

## 2023-03-22 NOTE — Telephone Encounter (Signed)
Pt is requesting to be worked in to see PCP this week. Can she be worked in on Thursday, 8/15?   I put med problem list that patient brought in, in PCPs box, for PCP to look at, per pt request.

## 2023-03-25 ENCOUNTER — Encounter: Payer: Self-pay | Admitting: Family

## 2023-03-25 ENCOUNTER — Ambulatory Visit (INDEPENDENT_AMBULATORY_CARE_PROVIDER_SITE_OTHER): Payer: Medicare HMO | Admitting: Family

## 2023-03-25 VITALS — BP 122/54 | HR 75 | Temp 97.9°F | Ht 62.0 in | Wt 121.2 lb

## 2023-03-25 DIAGNOSIS — I10 Essential (primary) hypertension: Secondary | ICD-10-CM

## 2023-03-25 DIAGNOSIS — M810 Age-related osteoporosis without current pathological fracture: Secondary | ICD-10-CM

## 2023-03-25 DIAGNOSIS — F03A0 Unspecified dementia, mild, without behavioral disturbance, psychotic disturbance, mood disturbance, and anxiety: Secondary | ICD-10-CM

## 2023-03-25 DIAGNOSIS — E785 Hyperlipidemia, unspecified: Secondary | ICD-10-CM

## 2023-03-25 DIAGNOSIS — D329 Benign neoplasm of meninges, unspecified: Secondary | ICD-10-CM | POA: Diagnosis not present

## 2023-03-25 NOTE — Patient Instructions (Signed)
Memory Compensation Strategies  Use "WARM" strategy.  W= write it down  A= associate it  R= repeat it  M= make a mental note  2.   You can keep a Memory Notebook.  Use a 3-ring notebook with sections for the following: calendar, important names and phone numbers, medications, doctors' names/phone numbers, lists/reminders, and a section to journal what you did each day.   3.    Use a calendar to write appointments down.  4.    Write yourself a schedule for the day.  This can be placed on the calendar or in a separate section of the Memory Notebook.  Keeping a  regular schedule can help memory.  5.    Use medication organizer with sections for each day or morning/evening pills.  You may need help loading it  6.    Keep a basket, or pegboard by the door.  Place items that you need to take out with you in the basket or on the pegboard.  You may also want to include a message board for reminders.  7.    Use sticky notes.  Place sticky notes with reminders in a place where the task is performed.  For example: " turn off the  stove" placed by the stove, "lock the door" placed on the door at eye level, " take your medications" on the bathroom mirror or by the place where you normally take your medications.  8.    Use alarms/timers.  Use while cooking to remind yourself to check on food or as a reminder to take your medicine, or as a  reminder to make a call, or as a reminder to perform another task, etc.  

## 2023-03-25 NOTE — Progress Notes (Signed)
Subjective:    Patient ID: Kristina Whitaker, female    DOB: 05/18/1951, 72 y.o.   MRN: [id number]  Chief Complaint  Patient presents with   Medical Management of Chronic Issues   Pt presents to the office today for chronic follow up. She reports she was having a lot of dizziness and stop namenda and aricept. Reports this has greatly helped her dizziness.     She is followed by Neurosurgeon once a year for meningioma.    She has meningioma and had this removed on 12/04/16. Reports she was having similar memory problems before she had this removed and worried. She had MRI for 07/13/22 that, "No acute or reversible finding. Mild chronic small-vessel ischemic change of the cerebral hemispheric white matter."   She has osteoporosis and was prescribed Fosamax weekly.  She is not taking calcium or vit D. Her last dexa scan was 12/10/20.   She continues to refuse all labs. Has not had labs drawn since 2022.  Hypertension This is a chronic problem. The current episode started more than 1 year ago. The problem has been resolved since onset. The problem is controlled. Pertinent negatives include no malaise/fatigue, peripheral edema or shortness of breath. Risk factors for coronary artery disease include dyslipidemia. The current treatment provides moderate improvement.  Hyperlipidemia This is a chronic problem. The current episode started more than 1 year ago. The problem is controlled. Pertinent negatives include no shortness of breath. Current antihyperlipidemic treatment includes statins. The current treatment provides moderate improvement of lipids. Risk factors for coronary artery disease include dyslipidemia, hypertension, a sedentary lifestyle and post-menopausal.      Review of Systems  Constitutional:  Negative for malaise/fatigue.  Respiratory:  Negative for shortness of breath.   All other systems reviewed and are negative.      Objective:   Physical Exam Vitals reviewed.   Constitutional:      General: She is not in acute distress.    Appearance: She is well-developed.  HENT:     Head: Normocephalic and atraumatic.  Eyes:     Pupils: Pupils are equal, round, and reactive to light.  Neck:     Thyroid: No thyromegaly.  Cardiovascular:     Rate and Rhythm: Normal rate and regular rhythm.     Heart sounds: Normal heart sounds. No murmur heard. Pulmonary:     Effort: Pulmonary effort is normal. No respiratory distress.     Breath sounds: Normal breath sounds. No wheezing.  Abdominal:     General: Bowel sounds are normal. There is no distension.     Palpations: Abdomen is soft.     Tenderness: There is no abdominal tenderness.  Musculoskeletal:        General: No tenderness. Normal range of motion.     Cervical back: Normal range of motion and neck supple.     Right lower leg: Edema (trace) present.     Left lower leg: Edema (trace) present.  Skin:    General: Skin is warm and dry.  Neurological:     Mental Status: She is alert and oriented to person, place, and time.     Cranial Nerves: No cranial nerve deficit.     Deep Tendon Reflexes: Reflexes are normal and symmetric.  Psychiatric:        Mood and Affect: Affect is flat.        Behavior: Behavior normal.        Thought Content: Thought content normal.  Judgment: Judgment normal.     BP (!) 122/54   Pulse 75   Temp 97.9 F (36.6 C) (Temporal)   Ht 5\' 2"  (1.575 m)   Wt 121 lb 3.2 oz (55 kg)   SpO2 100%   BMI 22.17 kg/m      Assessment & Plan:  Kristina Whitaker comes in today with chief complaint of Medical Management of Chronic Issues   Diagnosis and orders addressed:  1. Mild dementia without behavioral disturbance, psychotic disturbance, mood disturbance, or anxiety, unspecified dementia type (HCC)  2. Hyperlipidemia, unspecified hyperlipidemia type  3. Primary hypertension  4. Meningioma (HCC)  5. Osteoporosis, unspecified osteoporosis type, unspecified pathological  fracture presence   Labs refused  Discussed memory strategies Continue current medications  Health Maintenance reviewed Diet and exercise encouraged  Follow up plan: 3 months    Jannifer Rodney, FNP

## 2023-04-17 ENCOUNTER — Other Ambulatory Visit: Payer: Self-pay | Admitting: Family

## 2023-04-17 DIAGNOSIS — I10 Essential (primary) hypertension: Secondary | ICD-10-CM

## 2023-05-13 ENCOUNTER — Ambulatory Visit: Payer: Medicare HMO | Admitting: Family

## 2023-05-13 ENCOUNTER — Encounter: Payer: Self-pay | Admitting: Family

## 2023-05-13 VITALS — BP 102/51 | HR 73 | Temp 98.0°F | Ht <= 58 in | Wt 120.6 lb

## 2023-05-13 DIAGNOSIS — F03A0 Unspecified dementia, mild, without behavioral disturbance, psychotic disturbance, mood disturbance, and anxiety: Secondary | ICD-10-CM | POA: Diagnosis not present

## 2023-05-13 DIAGNOSIS — I10 Essential (primary) hypertension: Secondary | ICD-10-CM

## 2023-05-13 NOTE — Patient Instructions (Signed)

## 2023-05-13 NOTE — Progress Notes (Signed)
   Subjective:    Patient ID: Kristina Whitaker, female    DOB: 1951-08-04, 72 y.o.   MRN: [id number]  Chief Complaint  Patient presents with   Medication Consultation    States memantine makes her throw up everytime she takes it not on recent med list     Pt presents to the office today with nausea every time she takes memantine 10 mg. Her last visit we took her off this medication, but she states she continued to take it. However, the last month she has stopped and her nausea is better.   She is taking OTC prevagen for dementia. This is not making her nauseous.  Hypertension This is a chronic problem. The current episode started more than 1 year ago. The problem has been waxing and waning since onset. The problem is uncontrolled. Associated symptoms include malaise/fatigue. Pertinent negatives include no peripheral edema, shortness of breath or sweats. Past treatments include calcium channel blockers. The current treatment provides moderate improvement.      Review of Systems  Constitutional:  Positive for malaise/fatigue.  Respiratory:  Negative for shortness of breath.   All other systems reviewed and are negative.      Objective:   Physical Exam Vitals reviewed.  Constitutional:      General: She is not in acute distress.    Appearance: She is well-developed.  HENT:     Head: Normocephalic and atraumatic.  Eyes:     Pupils: Pupils are equal, round, and reactive to light.  Neck:     Thyroid: No thyromegaly.  Cardiovascular:     Rate and Rhythm: Normal rate and regular rhythm.     Heart sounds: Normal heart sounds. No murmur heard. Pulmonary:     Effort: Pulmonary effort is normal. No respiratory distress.     Breath sounds: Normal breath sounds. No wheezing.  Abdominal:     General: Bowel sounds are normal. There is no distension.     Palpations: Abdomen is soft.     Tenderness: There is no abdominal tenderness.  Musculoskeletal:        General: No tenderness. Normal  range of motion.     Cervical back: Normal range of motion and neck supple.  Skin:    General: Skin is warm and dry.  Neurological:     Mental Status: She is alert and oriented to person, place, and time.     Cranial Nerves: No cranial nerve deficit.     Deep Tendon Reflexes: Reflexes are normal and symmetric.  Psychiatric:        Behavior: Behavior normal.        Thought Content: Thought content normal.        Judgment: Judgment normal.      BP (!) 102/51   Pulse 73   Temp 98 F (36.7 C) (Temporal)   Ht 2' (0.61 m)   Wt 120 lb 9.6 oz (54.7 kg)   SpO2 100%   BMI 147.21 kg/m      Assessment & Plan:  Kristina Whitaker comes in today with chief complaint of Medication Consultation (States memantine makes her throw up everytime she takes it not on recent med list /)   Diagnosis and orders addressed:  1. Mild dementia without behavioral disturbance, psychotic disturbance, mood disturbance, or anxiety, unspecified dementia type (HCC)   2. Primary hypertension   Stop memantine and continue otc prevagen Memory strategies Continue Norvasc  Keep chronic follow up  Jannifer Rodney, FNP

## 2023-05-24 ENCOUNTER — Telehealth: Payer: Self-pay | Admitting: Family

## 2023-05-24 NOTE — Telephone Encounter (Signed)
Appt made patient aware

## 2023-05-25 ENCOUNTER — Ambulatory Visit (INDEPENDENT_AMBULATORY_CARE_PROVIDER_SITE_OTHER): Payer: Medicare HMO | Admitting: Family

## 2023-05-25 ENCOUNTER — Encounter: Payer: Self-pay | Admitting: Family

## 2023-05-25 VITALS — BP 124/75 | HR 62 | Temp 97.3°F | Ht 61.0 in | Wt 120.6 lb

## 2023-05-25 DIAGNOSIS — R9230 Dense breasts, unspecified: Secondary | ICD-10-CM | POA: Diagnosis not present

## 2023-05-25 DIAGNOSIS — Z85820 Personal history of malignant melanoma of skin: Secondary | ICD-10-CM | POA: Diagnosis not present

## 2023-05-25 DIAGNOSIS — N644 Mastodynia: Secondary | ICD-10-CM | POA: Diagnosis not present

## 2023-05-25 DIAGNOSIS — L989 Disorder of the skin and subcutaneous tissue, unspecified: Secondary | ICD-10-CM

## 2023-05-25 NOTE — Patient Instructions (Signed)
Melanoma Melanoma is a form of skin cancer that begins in the skin cells that produce pigment (melanocytes). Melanoma usually starts as a mole or growth (lesion) on the skin and can spread to other areas of the body. If found early and treated, many cases of melanoma are curable. What are the causes? The exact cause of this condition is not known. What increases the risk? The following factors may make you more likely to develop this condition: Spending a lot of time in the sun, under a sunlamp, or in a tanning booth. Having a sunburn that blisters. The more blistering sunburns you have had, the higher your risk for melanoma becomes. Living in a hot, sunny climate, or where the sun is strong. Having fair skin that does not tan easily. Having a personal or family history of melanoma. Having many skin moles or unusual moles (dysplastic nevi). Having a weakened body defense system (immune system). What are the signs or symptoms? Symptoms of this condition include: ABCDE changes in a mole. ABCDE stands for: Asymmetry. This means the mole has an irregular shape. It is not round or oval. Border. This means the mole has an irregular or bumpy border. Color. This means the mole has multiple colors in it, including brown, black, blue, red, or tan. Diameter. This means the mole is more than 0.2 inches (6 mm) across. Evolving. This refers to any unusual changes or symptoms in the mole, such as pain, itching, stinging, sensitivity, or bleeding. A new mole or skin lesion. Symptoms that melanoma may have spread to other areas of the body include: Swollen lymph nodes. Shortness of breath. Bone pain. Headache. Seizures. Visual problems. How is this diagnosed? This condition is diagnosed by examining a tissue sample from the mole (biopsy). You may also have other tests, including: Blood tests. Chest X-rays. Sentinel node biopsy. A CT scan. A bone scan. If melanoma is confirmed, it will be staged to  determine its severity and extent. Staging is an assessment of: The size of the growth or lesion. Whether the cancer has spread. Where the cancer has spread. How is this treated? This condition is treated with surgery to remove the cancer as well as some of the tissue around where the cancer was found. Your lymph nodes may also be removed during the surgery. If melanoma has spread to other areas, such as the lymph nodes, liver, lungs, bone, or brain, you will need additional treatment. This may include: Targeted therapy. This treatment uses medicines that target specific genetic mutations linked to melanoma. Biological therapy, also called immunotherapy. This treatment acts on the immune system to help kill cancer cells. Radiation therapy. This treatment uses high-energy rays to kill cancer cells. Chemotherapy. This treatment uses medicine that kills cancer cells. A combination of surgery and other treatments may be recommended. Melanoma can come back after treatment (recur). Follow these instructions at home: Lifestyle  Stay out of the sun, especially during peak mid-afternoon hours. Take the following precautions every day, even on cloudy days and in the winter, and even if you will be outdoors for only a short period of time. Wear long sleeves, a hat, and sunglasses that block UV light when possible. Regularly apply a sunscreen with an SPF of 30 or higher. Consider cosmetics that contain an SPF. Limit sun exposure in the middle part of the day. Do not use any products that contain nicotine or tobacco. These products include cigarettes, chewing tobacco, and vaping devices, such as e-cigarettes. If you need help  quitting, ask your health care provider. General instructions Take over-the-counter and prescription medicines only as told by your health care provider. Learn as much as you can about your condition. Keep all follow-up visits. This is important. You will need to have regular visits  during and after treatment as a part of your care. Where to find more information National Cancer Institute: www.cancer.gov American Cancer Society: www.cancer.org American Academy of Dermatology: InfoExam.si Contact a health care provider if: You have any new growths or changes in your skin. You have had melanoma removed and you notice a new growth in the same location. You have any new pain or new symptoms. Get help right away if: You have a seizure. You have chest pain. You have trouble breathing. You have a fever. You have bleeding that does not stop. These symptoms may represent a serious problem that is an emergency. Do not wait to see if the symptoms will go away. Get medical help right away. Call your local emergency services (911 in the [place] Do not drive yourself to the hospital. Summary Melanoma is a form of skin cancer that begins in the skin cells that produce pigment (melanocytes). Melanoma usually starts as a mole or growth (lesion) on the skin and can spread to other areas of the body. This condition is diagnosed by examining a tissue sample from a mole (biopsy). If found early and treated, many cases of melanoma are curable. A combination of surgery and other treatments may be recommended. Keep all follow-up visits. This is important. You will need to have regular visits during and after treatment as a part of your care. This information is not intended to replace advice given to you by your health care provider. Make sure you discuss any questions you have with your health care provider. Document Revised: 01/02/2021 Document Reviewed: 01/02/2021 Elsevier Patient Education  2024 [employer].

## 2023-05-25 NOTE — Progress Notes (Signed)
Subjective:    Patient ID: Kristina Whitaker, female    DOB: 1951-05-11, 72 y.o.   MRN: [id number]  Chief Complaint  Patient presents with   Nevus    On finger been there a year color has changed and gotten bigger    Breast Mass    RIGHT BREAST SHE SAID HAS BEEN THERE 6 MTHS    Pt presents to the office today with mole changes on right index finger that she has had for years. However, over the last few months she has noticed it changes colors. She has hx of melanoma and this is worrisome. Does not have a current dermatologists.   She is also complaining of a nodule in right breast that she noticed 6 months ago. Denies any pain HPI    Review of Systems  All other systems reviewed and are negative.      Objective:   Physical Exam Vitals reviewed.  Constitutional:      General: She is not in acute distress.    Appearance: She is well-developed.  HENT:     Head: Normocephalic and atraumatic.  Eyes:     Pupils: Pupils are equal, round, and reactive to light.  Neck:     Thyroid: No thyromegaly.  Cardiovascular:     Rate and Rhythm: Normal rate and regular rhythm.     Heart sounds: Normal heart sounds. No murmur heard. Pulmonary:     Effort: Pulmonary effort is normal. No respiratory distress.     Breath sounds: Normal breath sounds. No wheezing.  Chest:  Breasts:    Right: Tenderness present. No swelling, bleeding, inverted nipple, mass, nipple discharge or skin change.     Left: No swelling, bleeding, inverted nipple, mass, nipple discharge, skin change or tenderness.     Comments: No mass felt on exam, dense tissue noted, Tenderness Abdominal:     General: Bowel sounds are normal. There is no distension.     Palpations: Abdomen is soft.     Tenderness: There is no abdominal tenderness.  Musculoskeletal:        General: No tenderness. Normal range of motion.     Cervical back: Normal range of motion and neck supple.  Skin:    General: Skin is warm and dry.     Findings:  Lesion present.     Comments: Abnormal nevus shape with purple and black, approx 0.3X0.2 cm  Neurological:     Mental Status: She is alert and oriented to person, place, and time.     Cranial Nerves: No cranial nerve deficit.     Deep Tendon Reflexes: Reflexes are normal and symmetric.  Psychiatric:        Behavior: Behavior normal.        Thought Content: Thought content normal.        Judgment: Judgment normal.      BP 124/75   Pulse 62   Temp (!) 97.3 F (36.3 C) (Temporal)   Ht 5\' 1"  (1.549 m)   Wt 120 lb 9.6 oz (54.7 kg)   BMI 22.79 kg/m       Assessment & Plan:  Jerrye Noble comes in today with chief complaint of Nevus (On finger been there a year color has changed and gotten bigger ) and Breast Mass (RIGHT BREAST SHE SAID HAS BEEN THERE 6 MTHS )   Diagnosis and orders addressed:  1. Skin lesion - Ambulatory referral to Dermatology  2. Dense breast - MM Digital Diagnostic Bilat; Future -  MM Digital Diagnostic Bilat; Future - US BREAST COMPLETE UNI RIGHT INC AXILLA  3. Breast tenderness - MM Digital Diagnostic Bilat; Future - MM Digital Diagnostic Bilat; Future - US BREAST COMPLETE UNI RIGHT INC AXILLA  4. History of melanoma - Ambulatory referral to Dermatology   Diagnostic mammogram and [place] pending  Referral to dermatologists pending  Follow up as needed   Jannifer Rodney, FNP

## 2023-06-17 ENCOUNTER — Telehealth: Payer: Self-pay

## 2023-06-17 ENCOUNTER — Ambulatory Visit (HOSPITAL_COMMUNITY)
Admission: RE | Admit: 2023-06-17 | Discharge: 2023-06-17 | Disposition: A | Discharge status: Home / Self Care | Payer: Medicare HMO | Source: Ambulatory Visit | Attending: Family | Admitting: Family

## 2023-06-17 DIAGNOSIS — N644 Mastodynia: Secondary | ICD-10-CM | POA: Diagnosis not present

## 2023-06-17 DIAGNOSIS — R92321 Mammographic fibroglandular density, right breast: Secondary | ICD-10-CM | POA: Diagnosis not present

## 2023-06-17 DIAGNOSIS — R9230 Dense breasts, unspecified: Secondary | ICD-10-CM | POA: Diagnosis not present

## 2023-06-17 DIAGNOSIS — N6312 Unspecified lump in the right breast, upper inner quadrant: Secondary | ICD-10-CM | POA: Diagnosis not present

## 2023-06-17 DIAGNOSIS — L989 Disorder of the skin and subcutaneous tissue, unspecified: Secondary | ICD-10-CM

## 2023-06-17 NOTE — Telephone Encounter (Signed)
Toni Amend can you check on this referral to dermatologists.

## 2023-06-17 NOTE — Telephone Encounter (Signed)
Copied from CRM [id number]. Topic: General - Other >> Jun 17, 2023 10:28 AM Eunice Blase wrote: Reason for CRM: Pt called to communicate that she went for breast xray. Please call hospital for results and now pt needs referral to dermologist. Pt wants to be contacted [phone].

## 2023-06-18 NOTE — Telephone Encounter (Signed)
I have R/C to Patient.  Patient's Referral was sent to [facility] - If she would like to call and check on Appt scheduling status she may call their Office at [phone].

## 2023-06-18 NOTE — Telephone Encounter (Signed)
Can you let patient know

## 2023-06-25 ENCOUNTER — Ambulatory Visit: Payer: Medicare HMO | Admitting: Family

## 2023-08-12 ENCOUNTER — Ambulatory Visit: Payer: Medicare HMO | Admitting: Family

## 2023-08-18 ENCOUNTER — Other Ambulatory Visit: Payer: Self-pay | Admitting: Family

## 2023-08-18 DIAGNOSIS — I10 Essential (primary) hypertension: Secondary | ICD-10-CM

## 2023-08-18 NOTE — Telephone Encounter (Signed)
 Done

## 2023-08-18 NOTE — Telephone Encounter (Signed)
[facility] in April for 6 mos FU RF sent to pharmacy

## 2023-09-11 ENCOUNTER — Other Ambulatory Visit: Payer: Self-pay | Admitting: Family

## 2023-09-11 DIAGNOSIS — I10 Essential (primary) hypertension: Secondary | ICD-10-CM

## 2023-10-20 ENCOUNTER — Encounter: Payer: Self-pay | Admitting: Family Medicine

## 2023-10-20 ENCOUNTER — Ambulatory Visit (INDEPENDENT_AMBULATORY_CARE_PROVIDER_SITE_OTHER): Admitting: Family Medicine

## 2023-10-20 VITALS — BP 128/50 | HR 68 | Temp 98.1°F | Ht 61.0 in | Wt 122.4 lb

## 2023-10-20 DIAGNOSIS — W19XXXA Unspecified fall, initial encounter: Secondary | ICD-10-CM | POA: Diagnosis not present

## 2023-10-20 DIAGNOSIS — Z85820 Personal history of malignant melanoma of skin: Secondary | ICD-10-CM | POA: Diagnosis not present

## 2023-10-20 DIAGNOSIS — Z1283 Encounter for screening for malignant neoplasm of skin: Secondary | ICD-10-CM | POA: Diagnosis not present

## 2023-10-20 DIAGNOSIS — F0390 Unspecified dementia without behavioral disturbance: Secondary | ICD-10-CM

## 2023-10-20 NOTE — Progress Notes (Signed)
 Acute Office Visit  Subjective:     Patient ID: Kristina Whitaker, female    DOB: [birth year], 73 y.o.   MRN: [id number]  Chief Complaint  Patient presents with   Fall    Fall The accident occurred More than 1 week ago. Fall occurred: walking up steps. She fell from a height of 1 to 2 ft. Impact surface: brick. There was no blood loss. Point of impact: right breast. The patient is experiencing no pain. She has tried rest for the symptoms. The treatment provided significant relief.   Pain now resolved. Denies chest pain, tenderness, shortness of breath. Denies head injury, LOC, focal weakness. Denies recurrent falls. Reports last fall was years ago.   Would like referral to dermatology. Has history of melanoma and hasn't been seen in years. She is unable to get to [facility] where last referral was authorized for.   ROS As per HPI.      Objective:    BP (!) 128/50   Pulse 68   Temp 98.1 F (36.7 C) (Temporal)   Ht 5\' 1"  (1.549 m)   Wt 122 lb 6.4 oz (55.5 kg)   SpO2 100%   BMI 23.13 kg/m    Physical Exam Vitals and nursing note reviewed.  Constitutional:      General: She is not in acute distress.    Appearance: She is not ill-appearing, toxic-appearing or diaphoretic.  HENT:     Head: Atraumatic.  Eyes:     Extraocular Movements: Extraocular movements intact.     Pupils: Pupils are equal, round, and reactive to light.  Cardiovascular:     Rate and Rhythm: Normal rate and regular rhythm.     Heart sounds: Normal heart sounds. No murmur heard. Pulmonary:     Effort: Pulmonary effort is normal. No respiratory distress.     Breath sounds: Normal breath sounds. No wheezing, rhonchi or rales.  Chest:     Chest wall: No lacerations, deformity, swelling, tenderness, crepitus or edema.  Musculoskeletal:     Right lower leg: No edema.     Left lower leg: No edema.  Skin:    General: Skin is warm and dry.     Findings: No abrasion, bruising, signs of injury or laceration.   Neurological:     Mental Status: She is alert. Mental status is at baseline.     Cranial Nerves: No cranial nerve deficit.     Sensory: No sensory deficit.     Motor: No weakness.     Coordination: Coordination normal.     Gait: Gait normal.  Psychiatric:        Mood and Affect: Mood normal.        Behavior: Behavior normal.     No results found for any visits on 10/20/23.      Assessment & Plan:   Kristina Whitaker was seen today for fall.  Diagnoses and all orders for this visit:  Fall, initial encounter  Dementia without behavioral disturbance, psychotic disturbance, mood disturbance, or anxiety, unspecified dementia severity, unspecified dementia type (HCC)  Skin cancer screening -     Cancel: Ambulatory referral to Dermatology -     Ambulatory referral to Dermatology  Hx of melanoma of skin -     Ambulatory referral to Dermatology   History difficult to obtain due to dementia. Patient is alone today. No signs of injury today. Reported fall was weeks ago. Denies pain. Referral to dermatology discussed and placed today.    Return if  symptoms worsen or fail to improve.  Gabriel Earing, FNP

## 2023-10-21 ENCOUNTER — Ambulatory Visit: Admitting: Family

## 2023-10-26 ENCOUNTER — Ambulatory Visit: Payer: Medicare HMO | Admitting: Dermatology

## 2023-11-07 IMAGING — MG MM DIGITAL SCREENING BILAT W/ TOMO AND CAD
8 series · 8 of 24 positions shown · non-contrast
Comparison: Previous exam(s).

CLINICAL DATA: Screening.

EXAM:
DIGITAL SCREENING BILATERAL MAMMOGRAM WITH TOMOSYNTHESIS AND CAD
TECHNIQUE: Bilateral screening digital craniocaudal and mediolateral oblique
mammograms were obtained. Bilateral screening digital breast
tomosynthesis was performed. The images were evaluated with
computer-aided detection.

[L MLO synth-2D]
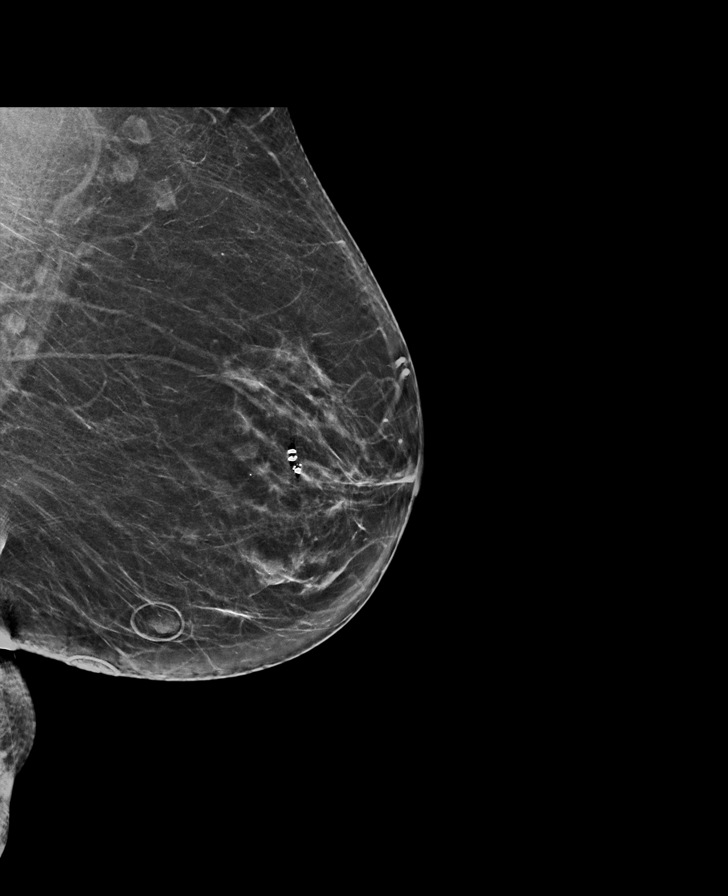

[R MLO synth-2D]
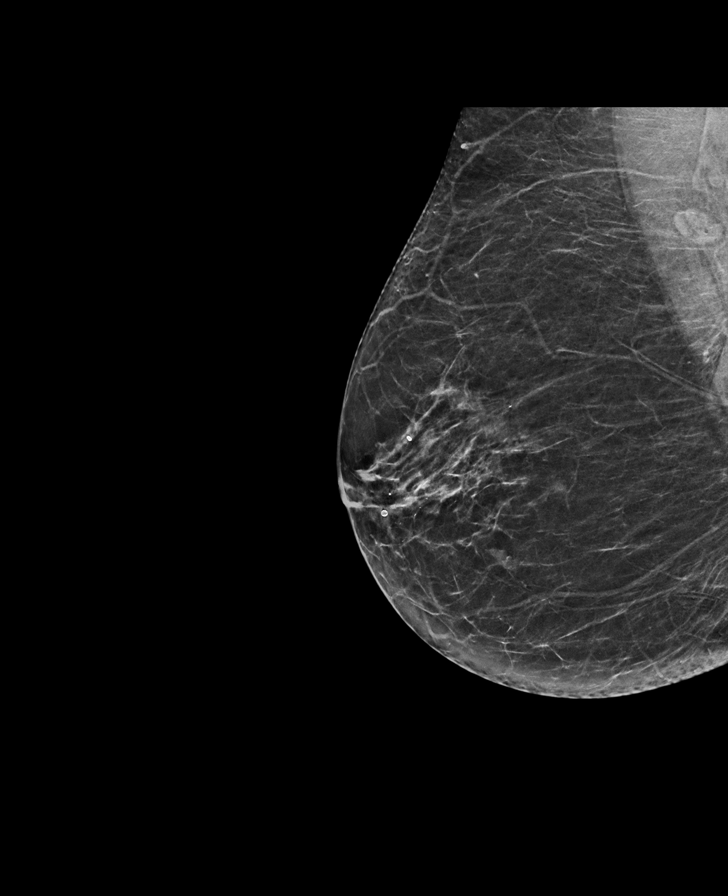

[R CC synth-2D]
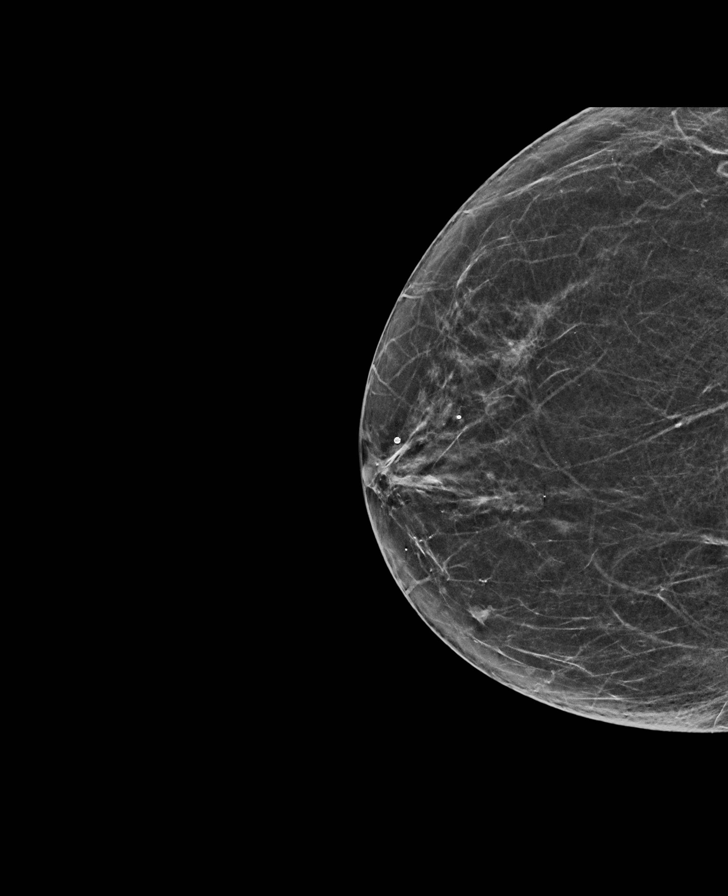

[L CC synth-2D]
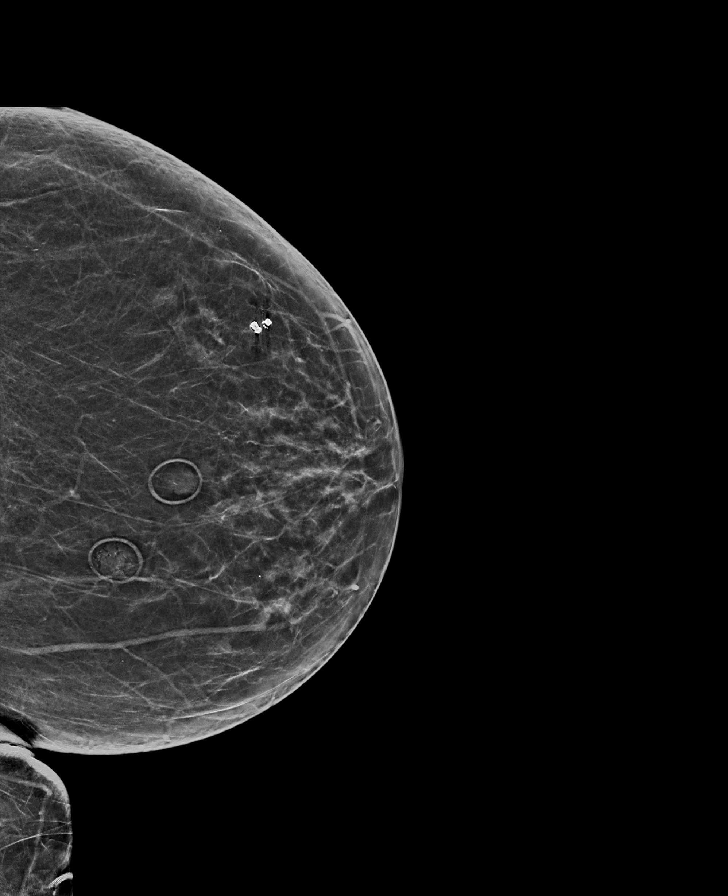

[R CC tomo · tomo slice 29/58.0]
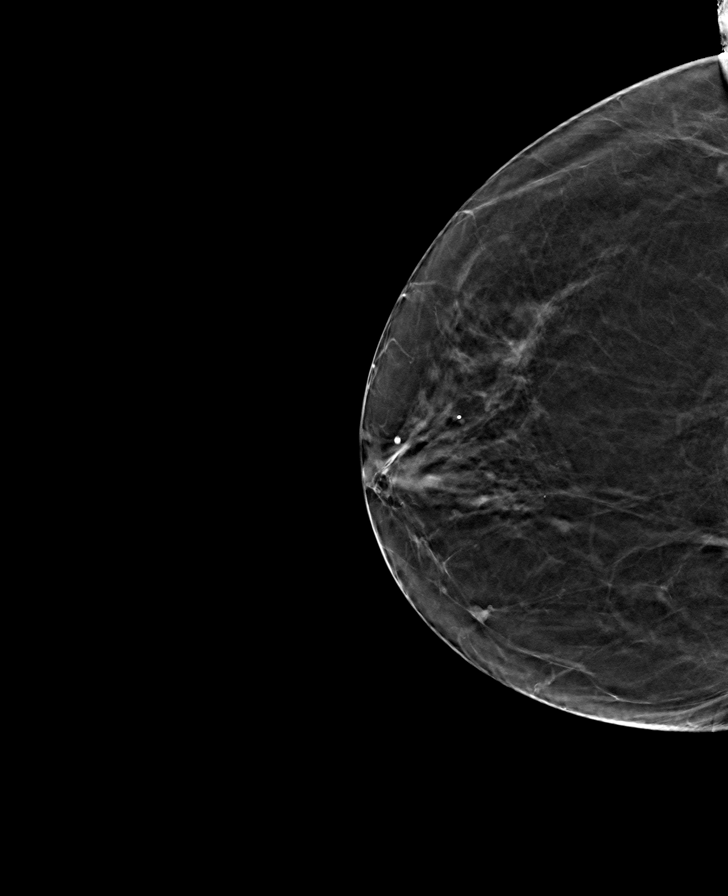

[R MLO tomo · tomo slice 32/63.0]
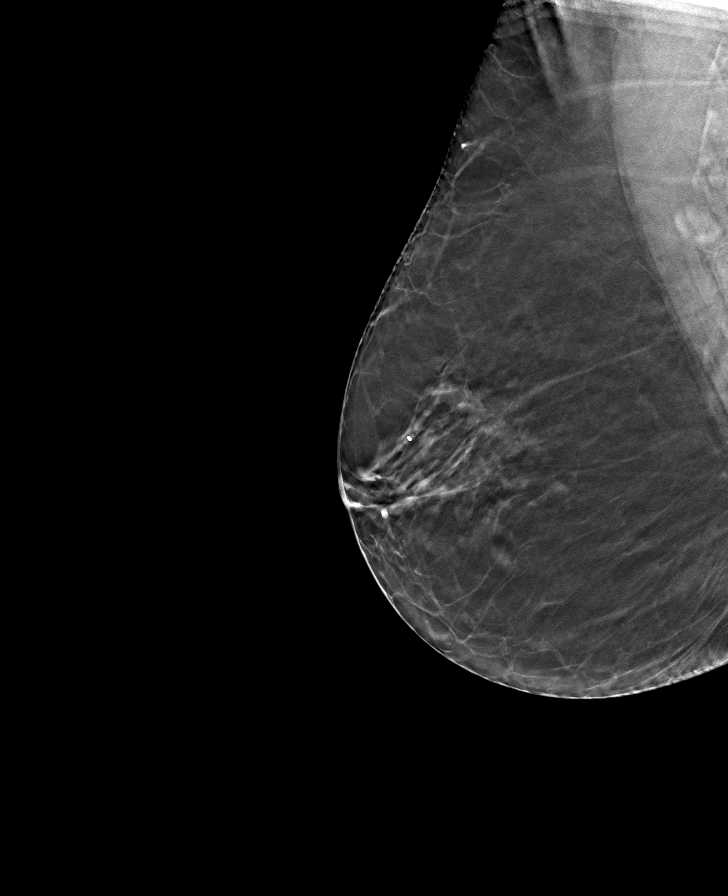

[L MLO tomo · tomo slice 35/69.0]
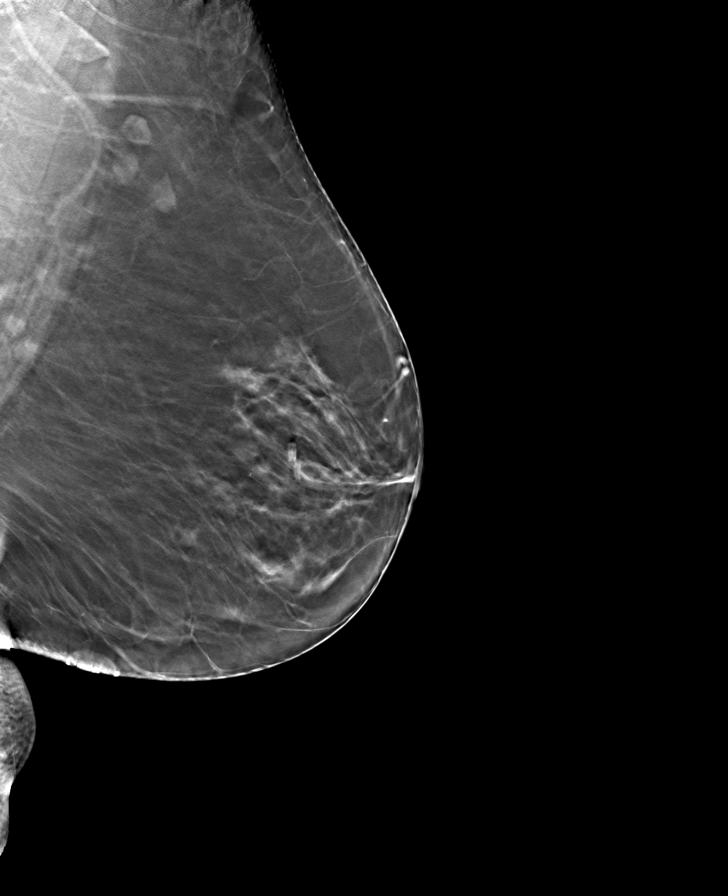

[L CC tomo · tomo slice 31/60.0]
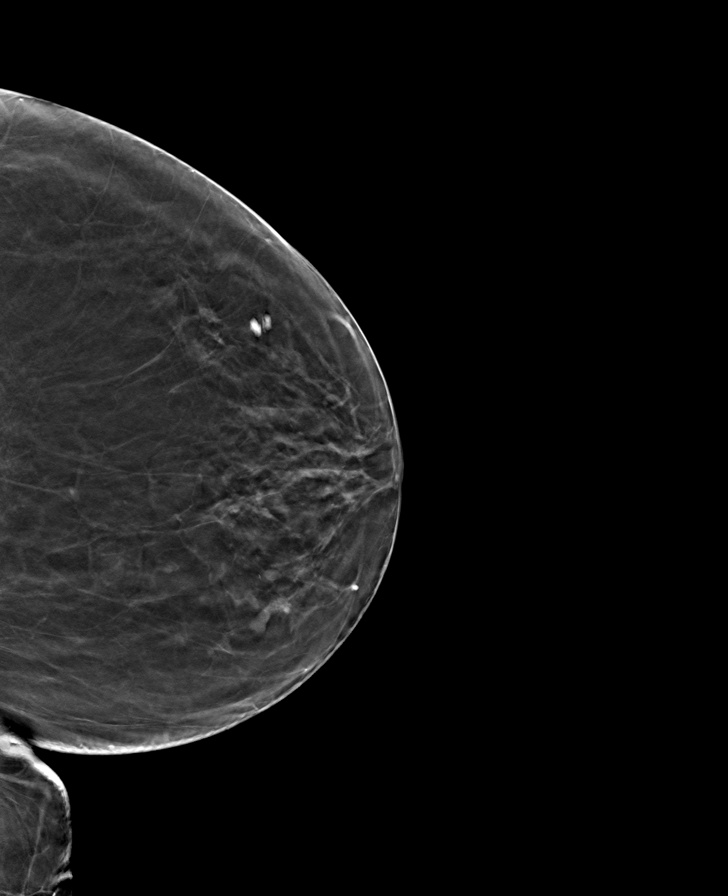

[8 of 24 positions shown; findings below may reference images not displayed]

ACR Breast Density Category b: There are scattered areas of
fibroglandular density.
FINDINGS: There are no findings suspicious for malignancy.
IMPRESSION: No mammographic evidence of malignancy. A result letter of this
screening mammogram will be mailed directly to the patient.

RECOMMENDATION:
Screening mammogram in one year. (Code:51-O-LD2)

BI-RADS CATEGORY  1: Negative.

## 2023-11-10 NOTE — Progress Notes (Signed)
 This encounter was created in error - please disregard.

## 2023-11-15 ENCOUNTER — Other Ambulatory Visit: Payer: Self-pay | Admitting: Family

## 2023-11-15 DIAGNOSIS — I10 Essential (primary) hypertension: Secondary | ICD-10-CM

## 2023-11-16 ENCOUNTER — Ambulatory Visit: Payer: Medicare HMO | Admitting: Family

## 2023-11-16 ENCOUNTER — Encounter: Payer: Self-pay | Admitting: Family

## 2023-11-16 VITALS — BP 121/53 | HR 60 | Temp 97.5°F | Ht 61.0 in | Wt 124.8 lb

## 2023-11-16 DIAGNOSIS — Z0001 Encounter for general adult medical examination with abnormal findings: Secondary | ICD-10-CM | POA: Diagnosis not present

## 2023-11-16 DIAGNOSIS — F0390 Unspecified dementia without behavioral disturbance: Secondary | ICD-10-CM | POA: Diagnosis not present

## 2023-11-16 DIAGNOSIS — E785 Hyperlipidemia, unspecified: Secondary | ICD-10-CM

## 2023-11-16 DIAGNOSIS — D329 Benign neoplasm of meninges, unspecified: Secondary | ICD-10-CM

## 2023-11-16 DIAGNOSIS — M810 Age-related osteoporosis without current pathological fracture: Secondary | ICD-10-CM

## 2023-11-16 DIAGNOSIS — Z0000 Encounter for general adult medical examination without abnormal findings: Secondary | ICD-10-CM

## 2023-11-16 DIAGNOSIS — I10 Essential (primary) hypertension: Secondary | ICD-10-CM

## 2023-11-16 NOTE — Progress Notes (Signed)
 Subjective:    Patient ID: Kristina Whitaker, female    DOB: 02-21-1951, 73 y.o.   MRN: [id number]  Chief Complaint  Patient presents with   Medical Management of Chronic Issues    Feels over whelmed     Pt presents to the office today for chronic follow up.  She is followed by Neurosurgeon once a year for meningioma.    She has meningioma and had this removed on 12/04/16. Reports she was having similar memory problems before she had this removed and worried. She had MRI for 07/13/22 that, "No acute or reversible finding. Mild chronic small-vessel ischemic change of the cerebral hemispheric white matter."   She has osteoporosis and was prescribed Fosamax weekly.  She is not taking calcium or vit D. Her last dexa scan was 02/16/23..  She continues to refuse all labs. Has not had labs drawn since 2022.  Hypertension This is a chronic problem. The current episode started more than 1 year ago. The problem has been resolved since onset. The problem is controlled. Associated symptoms include malaise/fatigue. Pertinent negatives include no peripheral edema or shortness of breath. Risk factors for coronary artery disease include sedentary lifestyle, obesity and dyslipidemia. Past treatments include angiotensin blockers. The current treatment provides moderate improvement.  Hyperlipidemia This is a chronic problem. The current episode started more than 1 year ago. The problem is uncontrolled. Pertinent negatives include no shortness of breath. Current antihyperlipidemic treatment includes statins. The current treatment provides moderate improvement of lipids. Risk factors for coronary artery disease include dyslipidemia, hypertension and a sedentary lifestyle.      Review of Systems  Constitutional:  Positive for malaise/fatigue.  Respiratory:  Negative for shortness of breath.   All other systems reviewed and are negative.  Family History  Problem Relation Age of Onset   Heart attack Sister     Stroke Sister    Heart attack Maternal Aunt    Heart attack Paternal Uncle    Stroke Father    Heart attack Father    Heart attack Brother    Social History   Socioeconomic History   Marital status: Married    Spouse name: Kristina Whitaker   Number of children: 1   Years of education: 16   Highest education level: Not on file  Occupational History   Occupation: retired    Comment: Art gallery manager  Tobacco Use   Smoking status: Never   Smokeless tobacco: Never  Vaping Use   Vaping status: Not on file  Substance and Sexual Activity   Alcohol use: Not Currently    Alcohol/week: 1.0 standard drink of alcohol    Types: 1 Glasses of wine per week    Comment: occasional glass of wine   Drug use: No   Sexual activity: Yes  Other Topics Concern   Not on file  Social History Narrative   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    She lives at home with her husband Kristina Whitaker.    They have one son that lives Krist[place] Whitaker.    Kristina Whitaker enjoys traveling.    She has an outside cat and an inside Armed forces logistics/support/administrative officer.    Social Drivers of Corporate investment banker Strain: Low Risk  (11/09/2022)   Overall Financial Resource Strain (CARDIA)    Difficulty of Paying Living Expenses: Not hard at all  Food Insecurity: No Food Insecurity (11/09/2022)   Hunger Vital Sign    Worried About Running Out of Food in the Last Year: Never true  Ran Out of Food in the Last Year: Never true  Transportation Needs: No Transportation Needs (11/09/2022)   PRAPARE - Administrator, Civil Service (Medical): No    Lack of Transportation (Non-Medical): No  Physical Activity: Insufficiently Active (11/09/2022)   Exercise Vital Sign    Days of Exercise per Week: 3 days    Minutes of Exercise per Session: 30 min  Stress: No Stress Concern Present (11/09/2022)   [employer] of Occupational Health - Occupational Stress Questionnaire    Feeling of Stress : Not at all  Social Connections: Moderately Integrated (11/09/2022)   Social Connection and  Isolation Panel [NHANES]    Frequency of Communication with Friends and Family: More than three times a week    Frequency of Social Gatherings with Friends and Family: More than three times a week    Attends Religious Services: 1 to 4 times per year    Active Member of [employer] or Organizations: No    Attends Banker Meetings: Never    Marital Status: Married       Objective:   Physical Exam Vitals reviewed.  Constitutional:      General: She is not in acute distress.    Appearance: She is well-developed.  HENT:     Head: Normocephalic and atraumatic.     Right Ear: Tympanic membrane normal.     Left Ear: Tympanic membrane normal.  Eyes:     Pupils: Pupils are equal, round, and reactive to light.  Neck:     Thyroid: No thyromegaly.  Cardiovascular:     Rate and Rhythm: Normal rate and regular rhythm.     Heart sounds: Normal heart sounds. No murmur heard. Pulmonary:     Effort: Pulmonary effort is normal. No respiratory distress.     Breath sounds: Normal breath sounds. No wheezing.  Abdominal:     General: Bowel sounds are normal. There is no distension.     Palpations: Abdomen is soft.     Tenderness: There is no abdominal tenderness.  Musculoskeletal:        General: No tenderness. Normal range of motion.     Cervical back: Normal range of motion and neck supple.  Skin:    General: Skin is warm and dry.  Neurological:     Mental Status: She is alert and oriented to person, place, and time.     Cranial Nerves: No cranial nerve deficit.     Deep Tendon Reflexes: Reflexes are normal and symmetric.  Psychiatric:        Behavior: Behavior normal.        Thought Content: Thought content normal.        Judgment: Judgment normal.     BP (!) 121/53   Pulse 60   Temp (!) 97.5 F (36.4 C)   Ht 5\' 1"  (1.549 m)   Wt 124 lb 12.8 oz (56.6 kg)   SpO2 99%   BMI 23.58 kg/m        Assessment & Plan:  Kristina Whitaker comes in today with chief complaint of Medical  Management of Chronic Issues (Feels over whelmed )   Diagnosis and orders addressed:  1. Annual physical exam (Primary) - CMP14+EGFR - CBC with Differential/Platelet - Lipid panel - TSH - VITAMIN D 25 Hydroxy (Vit-D Deficiency, Fractures)  2. Hyperlipidemia, unspecified hyperlipidemia type - CMP14+EGFR - CBC with Differential/Platelet - Lipid panel  3. Dementia without behavioral disturbance, psychotic disturbance, mood disturbance, or anxiety, unspecified dementia  severity, unspecified dementia type (HCC) - CMP14+EGFR - CBC with Differential/Platelet  4. Osteoporosis, unspecified osteoporosis type, unspecified pathological fracture presence  - CMP14+EGFR - CBC with Differential/Platelet  5. Meningioma (HCC) - CMP14+EGFR - CBC with Differential/Platelet  6. Primary hypertension - CMP14+EGFR - CBC with Differential/Platelet     Labs refused  Discussed in length I could not refill medications until she gets labs. Pt continues to refuse.  Health Maintenance reviewed Diet and exercise encouraged  Follow up plan: 4 months    Jannifer Rodney, FNP

## 2023-11-16 NOTE — Patient Instructions (Signed)
 Health Maintenance After Age 74 After age 4, you are at a higher risk for certain long-term diseases and infections as well as injuries from falls. Falls are a major cause of broken bones and head injuries in people who are older than age 47. Getting regular preventive care can help to keep you healthy and well. Preventive care includes getting regular testing and making lifestyle changes as recommended by your health care provider. Talk with your health care provider about: Which screenings and tests you should have. A screening is a test that checks for a disease when you have no symptoms. A diet and exercise plan that is right for you. What should I know about screenings and tests to prevent falls? Screening and testing are the best ways to find a health problem early. Early diagnosis and treatment give you the best chance of managing medical conditions that are common after age 37. Certain conditions and lifestyle choices may make you more likely to have a fall. Your health care provider may recommend: Regular vision checks. Poor vision and conditions such as cataracts can make you more likely to have a fall. If you wear glasses, make sure to get your prescription updated if your vision changes. Medicine review. Work with your health care provider to regularly review all of the medicines you are taking, including over-the-counter medicines. Ask your health care provider about any side effects that may make you more likely to have a fall. Tell your health care provider if any medicines that you take make you feel dizzy or sleepy. Strength and balance checks. Your health care provider may recommend certain tests to check your strength and balance while standing, walking, or changing positions. Foot health exam. Foot pain and numbness, as well as not wearing proper footwear, can make you more likely to have a fall. Screenings, including: Osteoporosis screening. Osteoporosis is a condition that causes  the bones to get weaker and break more easily. Blood pressure screening. Blood pressure changes and medicines to control blood pressure can make you feel dizzy. Depression screening. You may be more likely to have a fall if you have a fear of falling, feel depressed, or feel unable to do activities that you used to do. Alcohol use screening. Using too much alcohol can affect your balance and may make you more likely to have a fall. Follow these instructions at home: Lifestyle Do not drink alcohol if: Your health care provider tells you not to drink. If you drink alcohol: Limit how much you have to: 0-1 drink a day for women. 0-2 drinks a day for men. Know how much alcohol is in your drink. In the [place] one drink equals one 12 oz bottle of beer (355 mL), one 5 oz glass of wine (148 mL), or one 1 oz glass of hard liquor (44 mL). Do not use any products that contain nicotine or tobacco. These products include cigarettes, chewing tobacco, and vaping devices, such as e-cigarettes. If you need help quitting, ask your health care provider. Activity  Follow a regular exercise program to stay fit. This will help you maintain your balance. Ask your health care provider what types of exercise are appropriate for you. If you need a cane or walker, use it as recommended by your health care provider. Wear supportive shoes that have nonskid soles. Safety  Remove any tripping hazards, such as rugs, cords, and clutter. Install safety equipment such as grab bars in bathrooms and safety rails on stairs. Keep rooms and walkways  well-lit. General instructions Talk with your health care provider about your risks for falling. Tell your health care provider if: You fall. Be sure to tell your health care provider about all falls, even ones that seem minor. You feel dizzy, tiredness (fatigue), or off-balance. Take over-the-counter and prescription medicines only as told by your health care provider. These include  supplements. Eat a healthy diet and maintain a healthy weight. A healthy diet includes low-fat dairy products, low-fat (lean) meats, and fiber from whole grains, beans, and lots of fruits and vegetables. Stay current with your vaccines. Schedule regular health, dental, and eye exams. Summary Having a healthy lifestyle and getting preventive care can help to protect your health and wellness after age 11. Screening and testing are the best way to find a health problem early and help you avoid having a fall. Early diagnosis and treatment give you the best chance for managing medical conditions that are more common for people who are older than age 28. Falls are a major cause of broken bones and head injuries in people who are older than age 48. Take precautions to prevent a fall at home. Work with your health care provider to learn what changes you can make to improve your health and wellness and to prevent falls. This information is not intended to replace advice given to you by your health care provider. Make sure you discuss any questions you have with your health care provider. Document Revised: 12/16/2020 Document Reviewed: 12/16/2020 Elsevier Patient Education  2024 [employer].

## 2023-12-20 ENCOUNTER — Other Ambulatory Visit: Payer: Self-pay | Admitting: Family

## 2023-12-20 DIAGNOSIS — I10 Essential (primary) hypertension: Secondary | ICD-10-CM

## 2024-02-14 ENCOUNTER — Other Ambulatory Visit: Payer: Self-pay | Admitting: Family

## 2024-02-14 DIAGNOSIS — I10 Essential (primary) hypertension: Secondary | ICD-10-CM

## 2024-02-14 DIAGNOSIS — F03A0 Unspecified dementia, mild, without behavioral disturbance, psychotic disturbance, mood disturbance, and anxiety: Secondary | ICD-10-CM

## 2024-02-14 DIAGNOSIS — E785 Hyperlipidemia, unspecified: Secondary | ICD-10-CM

## 2024-02-24 ENCOUNTER — Telehealth: Payer: Self-pay | Admitting: *Deleted

## 2024-02-24 MED ORDER — DONEPEZIL HCL 10 MG PO TBDP: 10.0000 mg | ORAL_TABLET | Freq: Every day | ORAL | 1 refills | Status: DC

## 2024-02-24 NOTE — Addendum Note (Signed)
 Addended by: LAVELL LYE A on: 02/24/2024 03:31 PM   Modules accepted: Orders

## 2024-02-24 NOTE — Telephone Encounter (Signed)
 Doneprezil  Prescription sent to pharmacy

## 2024-02-24 NOTE — Telephone Encounter (Signed)
 Pt aware and verbalized understanding.

## 2024-02-24 NOTE — Telephone Encounter (Signed)
 Husband came in today for refill on pt's Doneprezil has been taking and only has 3 pills left This is not on pt's med list was taken off on 03/25/23 reported as pt not taking Please advise on refill and send to [facility] pharmacy Please call husband back at [phone]

## 2024-02-26 ENCOUNTER — Other Ambulatory Visit: Payer: Self-pay | Admitting: Family

## 2024-02-26 DIAGNOSIS — F03A0 Unspecified dementia, mild, without behavioral disturbance, psychotic disturbance, mood disturbance, and anxiety: Secondary | ICD-10-CM

## 2024-02-28 NOTE — Telephone Encounter (Signed)
 02/24/24 Donepezil  script was for ODT 10 mg sent to [facility] in [place] Note from [facility] pharmacy: PATIENT SAYS OFFICE TOLD HER RX WAS SENT YESTERDAY BUT WE NEVER RECEIVED ONE  The are requesting regular tabs will you please sent correct script

## 2024-03-21 ENCOUNTER — Ambulatory Visit

## 2024-03-21 VITALS — BP 121/53 | HR 60 | Ht 61.0 in | Wt 124.0 lb

## 2024-03-21 DIAGNOSIS — Z0000 Encounter for general adult medical examination without abnormal findings: Secondary | ICD-10-CM | POA: Diagnosis not present

## 2024-03-21 NOTE — Progress Notes (Signed)
 Kristina Whitaker is a 73 y.o.   Subjective:  who presents for a Medicare Wellness preventive visit.  As a reminder, Annual Wellness Visits don't include a physical exam, and some assessments may be limited, especially if this visit is performed virtually. We may recommend an in-person follow-up visit with your provider if needed.  Visit Complete: Virtual I connected with  Kristina Whitaker on 03/21/24 by a audio enabled telemedicine application and verified that I am speaking with the correct person using two identifiers.  Patient Location: Home  Provider Location: Home Office  I discussed the limitations of evaluation and management by telemedicine. The patient expressed understanding and agreed to proceed.  Vital Signs: Because this visit was a virtual/telehealth visit, some criteria may be missing or patient reported. Any vitals not documented were not able to be obtained and vitals that have been documented are patient reported.  VideoDeclined- This patient declined Librarian, academic. Therefore the visit was completed with audio only.  Persons Participating in Visit: Patient.  AWV Questionnaire: No: Patient Medicare AWV questionnaire was not completed prior to this visit.  Cardiac Risk Factors include: advanced age (>75men, >96 women);dyslipidemia;hypertension     Objective:    Today's Vitals   03/21/24 1412  BP: (!) 121/53  Pulse: 60  Weight: 124 lb (56.2 kg)  Height: 5' 1 (1.549 m)   Body mass index is 23.43 kg/m.     03/21/2024    2:15 PM 11/09/2022    2:40 PM 11/07/2021    8:52 AM 11/05/2020    8:20 AM 11/03/2019    9:45 AM  Advanced Directives  Does Patient Have a Medical Advance Directive? No No Yes Yes Yes  Type of Surveyor, minerals;Living will Living will Healthcare Power of [place];Living will;Out of facility DNR (pink MOST or yellow form)  Does patient want to make changes to medical advance directive?     No - Patient declined   Copy of Healthcare Power of Attorney in Chart?   No - copy requested    Would patient like information on creating a medical advance directive?  No - Patient declined       Current Medications (verified) Outpatient Encounter Medications as of 03/21/2024  Medication Sig   alendronate  (FOSAMAX ) 70 MG tablet TAKE 1 TABLET ON AN EMPTY STOMACH WITH A FULL GLASS OF WATER EVERY 7 DAYS   amLODipine  (NORVASC ) 10 MG tablet TAKE ONE TABLET ONCE DAILY   Apoaequorin (PREVAGEN PO) Take by mouth.   aspirin 81 MG tablet Take 81 mg by mouth daily.   donepezil  (ARICEPT  ODT) 10 MG disintegrating tablet Take 1 tablet (10 mg total) by mouth at bedtime.   donepezil  (ARICEPT ) 10 MG tablet TAKE ONE TABLET AT BEDTIME   losartan  (COZAAR ) 100 MG tablet TAKE ONE TABLET DAILY   rosuvastatin  (CRESTOR ) 10 MG tablet TAKE 1 TABLET DAILY   No facility-administered encounter medications on file as of 03/21/2024.    Allergies (verified) Diethylpropion, Penicillins, Sulfa antibiotics, and Sulfa drugs cross reactors   History: Past Medical History:  Diagnosis Date   Cancer (HCC)    DVT (deep venous thrombosis) (HCC) 2015   Fibroids    uterine around age 30   Hypertension    Melanoma in situ of back (HCC)    1991   Ovarian tumor 2011   LMP   Past Surgical History:  Procedure Laterality Date   BACK SURGERY     melanoma exicision  from back   BREAST SURGERY     bilateral fibroadenomas/ cyst aspiration   FACIAL COSMETIC SURGERY     2006   fatty tumor removed from [facility]rian stem     OVARIAN CYST REMOVAL     x2 with rupture x2 in her 29s and 40s   SKIN SURGERY     between toes   Family History  Problem Relation Age of Onset   Heart attack Sister    Stroke Sister    Heart attack Maternal Aunt    Heart attack Paternal Uncle    Stroke Father    Heart attack Father    Heart attack Brother    Social History   Socioeconomic History   Marital status: Married    Spouse name: Todd    Number of children: 1   Years of education: 16   Highest education level: Not on file  Occupational History   Occupation: retired    Comment: Art gallery manager  Tobacco Use   Smoking status: Never   Smokeless tobacco: Never  Vaping Use   Vaping status: Not on file  Substance and Sexual Activity   Alcohol use: Not Currently    Alcohol/week: 1.0 standard drink of alcohol    Types: 1 Glasses of wine per week    Comment: occasional glass of wine   Drug use: No   Sexual activity: Yes  Other Topics Concern   Not on file  Social History Narrative   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    She lives at home with her husband Todd.    They have one son that lives [place].    Jasmaine enjoys traveling.    She has an outside cat and an inside Armed forces logistics/support/administrative officer.    Social Drivers of Corporate investment banker Strain: Low Risk  (03/21/2024)   Overall Financial Resource Strain (CARDIA)    Difficulty of Paying Living Expenses: Not hard at all  Food Insecurity: No Food Insecurity (03/21/2024)   Hunger Vital Sign    Worried About Running Out of Food in the Last Year: Never true    Ran Out of Food in the Last Year: Never true  Transportation Needs: No Transportation Needs (03/21/2024)   PRAPARE - Administrator, Civil Service (Medical): No    Lack of Transportation (Non-Medical): No  Physical Activity: Inactive (03/21/2024)   Exercise Vital Sign    Days of Exercise per Week: 0 days    Minutes of Exercise per Session: 0 min  Stress: No Stress Concern Present (03/21/2024)   [employer] of Occupational Health - Occupational Stress Questionnaire    Feeling of Stress: Not at all  Social Connections: Moderately Integrated (03/21/2024)   Social Connection and Isolation Panel    Frequency of Communication with Friends and Family: More than three times a week    Frequency of Social Gatherings with Friends and Family: More than three times a week    Attends Religious Services: Never    Database administrator  or Organizations: Yes    Attends Banker Meetings: Never    Marital Status: Married    Tobacco Counseling Counseling given: Yes    Clinical Intake:  Pre-visit preparation completed: Yes  Pain : No/denies pain     BMI - recorded: 23.43 Nutritional Status: BMI of 19-24  Normal Nutritional Risks: None Diabetes: No  No results found for: HGBA1C   How often do you need to have someone help you when you read  instructions, pamphlets, or other written materials from your doctor or pharmacy?: 1 - Never  Interpreter Needed?: No  Information entered by :: alia t/cma   Activities of Daily Living     03/21/2024    2:14 PM  In your present state of health, do you have any difficulty performing the following activities:  Hearing? 0  Vision? 0  Difficulty concentrating or making decisions? 1  Walking or climbing stairs? 0  Dressing or bathing? 0  Doing errands, shopping? 1  Preparing Food and eating ? N  Using the Toilet? N  In the past six months, have you accidently leaked urine? Y  Do you have problems with loss of bowel control? N  Managing your Medications? N  Managing your Finances? N  Housekeeping or managing your Housekeeping? N    Patient Care Team: Lavell Bari LABOR, FNP as PCP - General (Family Medicine) Rosine Yancy ORN (Neurosurgery) [facility] (Ophthalmology) Shona Rush, MD (Dermatology)  I have updated your Care Teams any recent Medical Services you may have received from other providers in the past year.     Assessment:   This is a routine wellness examination for [facility].  Hearing/Vision screen Hearing Screening - Comments:: Pt denies hearing dif Vision Screening - Comments:: Pt wear glasses/pt goes to [facility] in [place]/last 2025   Goals Addressed             This Visit's Progress    Patient Stated   On track    11/07/2021 AWV Goal: Exercise for General Health  Patient will verbalize understanding of the  benefits of increased physical activity: Exercising regularly is important. It will improve your overall fitness, flexibility, and endurance. Regular exercise also will improve your overall health. It can help you control your weight, reduce stress, and improve your bone density. Over the next year, patient will increase physical activity as tolerated with a goal of at least 150 minutes of moderate physical activity per week.  You can tell that you are exercising at a moderate intensity if your heart starts beating faster and you start breathing faster but can still hold a conversation. Moderate-intensity exercise ideas include: Walking 1 mile (1.6 km) in about 15 minutes Biking Hiking Golfing Dancing Water aerobics Patient will verbalize understanding of everyday activities that increase physical activity by providing examples like the following: Yard work, such as: Insurance underwriter Gardening Washing windows or floors Patient will be able to explain general safety guidelines for exercising:  Before you start a new exercise program, talk with your health care provider. Do not exercise so much that you hurt yourself, feel dizzy, or get very short of breath. Wear comfortable clothes and wear shoes with good support. Drink plenty of water while you exercise to prevent dehydration or heat stroke. Work out until your breathing and your heartbeat get faster.        Depression Screen     03/21/2024    2:19 PM 10/20/2023   10:32 AM 05/25/2023    8:02 AM 03/25/2023    8:38 AM 01/11/2023    8:03 AM 11/17/2022   10:16 AM 11/09/2022    2:39 PM  PHQ 2/9 Scores  PHQ - 2 Score 1 2 0 0 1 1 0  PHQ- 9 Score 1 5 0 1 3 2      Fall Risk     03/21/2024    2:09 PM 10/20/2023  10:32 AM 05/25/2023    8:02 AM 03/25/2023    8:37 AM 01/11/2023    8:03 AM  Fall Risk   Falls in the past year? 1 1 1 1  0  Number falls in past  yr: 0 1 0 0   Injury with Fall? 0 0 0 0   Risk for fall due to : History of fall(s);Impaired balance/gait;Orthopedic patient History of fall(s) No Fall Risks History of fall(s)   Follow up Falls evaluation completed;Education provided Falls evaluation completed Education provided Falls evaluation completed     MEDICARE RISK AT HOME:  Medicare Risk at Home Any stairs in or around the home?: Yes If so, are there any without handrails?: Yes Home free of loose throw rugs in walkways, pet beds, electrical cords, etc?: Yes Adequate lighting in your home to reduce risk of falls?: Yes Life alert?: No Use of a cane, walker or w/c?: No Grab bars in the bathroom?: Yes Shower chair or bench in shower?: Yes Elevated toilet seat or a handicapped toilet?: Yes  TIMED UP AND GO:  Was the test performed?  no  Cognitive Function: Declined/Normal: No cognitive concerns noted by patient or family. Patient alert, oriented, able to answer questions appropriately and recall recent events. No signs of memory loss or confusion.    06/10/2021    8:21 AM  MMSE - Mini Mental State Exam  Orientation to time 5  Orientation to Place 5  Registration 3  Attention/ Calculation 5  Recall 0  Language- name 2 objects 2  Language- repeat 1  Language- follow 3 step command 2  Language- read & follow direction 1  Write a sentence 1  Copy design 0  Total score 25        03/21/2024    2:15 PM 11/09/2022    2:41 PM 06/18/2022    9:40 AM 11/07/2021    8:35 AM 11/05/2020    8:24 AM  6CIT Screen  What Year? 0 points 0 points 4 points 0 points 0 points  What month? 0 points 0 points 0 points 0 points 0 points  What time? 0 points 3 points 0 points 0 points 0 points  Count back from 20 0 points 2 points 4 points 0 points 0 points  Months in reverse 0 points 4 points 4 points 2 points 0 points  Repeat phrase 0 points 8 points 10 points 6 points 0 points  Total Score 0 points 17 points 22 points 8 points 0 points     Immunizations Immunization History  Administered Date(s) Administered   Hepatitis A 06/07/1998, 03/28/1999   Hepatitis A, Adult 06/07/1998, 03/28/1999   Hepatitis B 11/15/2003   Influenza, Quadrivalent, Recombinant, Inj, Pf 05/25/2019   Janssen (J&J) SARS-COV-2 Vaccination 10/21/2019   Meningococcal Conjugate 04/27/2003   Meningococcal polysaccharide vaccine (MPSV4) 04/27/2003   Td 11/15/2003   Typhoid Inactivated 04/27/2003    Screening Tests Health Maintenance  Topic Date Due   Zoster Vaccines- Shingrix (1 of 2) Never done   Colonoscopy  Never done   Pneumococcal Vaccine: 50+ Years (1 of 1 - PCV) Never done   Hepatitis B Vaccines (2 of 3 - 19+ 3-dose series) 12/13/2003   COVID-19 Vaccine (2 - Janssen risk series) 11/18/2019   INFLUENZA VACCINE  03/10/2024   MAMMOGRAM  11/29/2024   Medicare Annual Wellness (AWV)  03/21/2025   DEXA SCAN  Completed   Hepatitis C Screening  Completed   HPV VACCINES  Aged Out   Meningococcal B  Vaccine  Aged Out   DTaP/Tdap/Td  Discontinued    Health Maintenance  Health Maintenance Due  Topic Date Due   Zoster Vaccines- Shingrix (1 of 2) Never done   Colonoscopy  Never done   Pneumococcal Vaccine: 50+ Years (1 of 1 - PCV) Never done   Hepatitis B Vaccines (2 of 3 - 19+ 3-dose series) 12/13/2003   COVID-19 Vaccine (2 - Janssen risk series) 11/18/2019   INFLUENZA VACCINE  03/10/2024   Health Maintenance Items Addressed: See Nurse Notes at the end of this note  Additional Screening:  Vision Screening: Recommended annual ophthalmology exams for early detection of glaucoma and other disorders of the eye. Would you like a referral to an eye doctor? No    Dental Screening: Recommended annual dental exams for proper oral hygiene  Community Resource Referral / Chronic Care Management: CRR required this visit?  No   CCM required this visit?  No   Plan:    I have personally reviewed and noted the following in the patient's chart:    Medical and social history Use of alcohol, tobacco or illicit drugs  Current medications and supplements including opioid prescriptions. Patient is not currently taking opioid prescriptions. Functional ability and status Nutritional status Physical activity Advanced directives List of other physicians Hospitalizations, surgeries, and ER visits in previous 12 months Vitals Screenings to include cognitive, depression, and falls Referrals and appointments  In addition, I have reviewed and discussed with patient certain preventive protocols, quality metrics, and best practice recommendations. A written personalized care plan for preventive services as well as general preventive health recommendations were provided to patient.   Ozie Ned, CMA   03/21/2024   After Visit Summary: (Declined) Due to this being a telephonic visit, with patients personalized plan was offered to patient but patient Declined AVS at this time   Notes: PCP Follow Up Recommendations: Pt is aware and due for the following and declined per pt: Colonoscopy, vaccines

## 2024-03-21 NOTE — Patient Instructions (Addendum)
 Ms. Zynda , Thank you for taking time out of your busy schedule to complete your Annual Wellness Visit with me. I enjoyed our conversation and look forward to speaking with you again next year. I, as well as your care team,  appreciate your ongoing commitment to your health goals. Please review the following plan we discussed and let me know if I can assist you in the future. Your Game plan/ To Do List    Referrals: If you haven't heard from the office you've been referred to, please reach out to them at the phone provided.   Follow up Visits: We will see or speak with you next year for your Next Medicare AWV with our clinical staff on 03/22/25 1:10p.m. Have you seen your provider in the last 6 months (3 months if uncontrolled diabetes)? Yes  Clinician Recommendations:  Aim for 30 minutes of exercise or brisk walking, 6-8 glasses of water, and 5 servings of fruits and vegetables each day.       This is a list of the screenings recommended for you:  Health Maintenance  Topic Date Due   Zoster (Shingles) Vaccine (1 of 2) Never done   Colon Cancer Screening  Never done   Pneumococcal Vaccine for age over 15 (1 of 1 - PCV) Never done   Hepatitis B Vaccine (2 of 3 - 19+ 3-dose series) 12/13/2003   COVID-19 Vaccine (2 - Janssen risk series) 11/18/2019   Medicare Annual Wellness Visit  11/09/2023   Flu Shot  03/10/2024   Mammogram  11/29/2024   DEXA scan (bone density measurement)  Completed   Hepatitis C Screening  Completed   HPV Vaccine  Aged Out   Meningitis B Vaccine  Aged Out   DTaP/Tdap/Td vaccine  Discontinued    Advanced directives: (Declined) Advance directive discussed with you today. Even though you declined this today, please call our office should you change your mind, and we can give you the proper paperwork for you to fill out. Advance Care Planning is important because it:  [x]  Makes sure you receive the medical care that is consistent with your values, goals, and  preferences  [x]  It provides guidance to your family and loved ones and reduces their decisional burden about whether or not they are making the right decisions based on your wishes.  Follow the link provided in your after visit summary or read over the paperwork we have mailed to you to help you started getting your Advance Directives in place. If you need assistance in completing these, please reach out to us  so that we can help you!  See attachments for Preventive Care and Fall Prevention Tips.

## 2024-04-11 ENCOUNTER — Telehealth: Payer: Self-pay | Admitting: Family

## 2024-04-11 NOTE — Telephone Encounter (Signed)
 Returned patient call to get more information on what kind of referral she is wanting.  Patient answered the phone but acted as if she wasn't herself. I let her know who I was and why I was calling. She said she would give the message to patient and if patient wanted to call us  back then she would.

## 2024-04-11 NOTE — Telephone Encounter (Signed)
 Patient came into office Friday afternoon.  She kept asking what she needed to do about a referral?  She was not sure and I told her I would put a message in for her and let the nurse give her a call to clear up any confusion she may have.

## 2024-05-15 ENCOUNTER — Other Ambulatory Visit: Payer: Self-pay | Admitting: Family

## 2024-05-15 DIAGNOSIS — I10 Essential (primary) hypertension: Secondary | ICD-10-CM

## 2024-05-15 DIAGNOSIS — E785 Hyperlipidemia, unspecified: Secondary | ICD-10-CM

## 2024-05-16 ENCOUNTER — Other Ambulatory Visit: Payer: Self-pay | Admitting: Family

## 2024-05-16 DIAGNOSIS — M810 Age-related osteoporosis without current pathological fracture: Secondary | ICD-10-CM

## 2024-05-18 ENCOUNTER — Encounter: Payer: Self-pay | Admitting: Family

## 2024-05-18 ENCOUNTER — Ambulatory Visit (INDEPENDENT_AMBULATORY_CARE_PROVIDER_SITE_OTHER): Admitting: Family

## 2024-05-18 VITALS — BP 113/43 | HR 61 | Temp 97.5°F | Ht 61.0 in | Wt 132.0 lb

## 2024-05-18 DIAGNOSIS — M810 Age-related osteoporosis without current pathological fracture: Secondary | ICD-10-CM | POA: Diagnosis not present

## 2024-05-18 DIAGNOSIS — F03A0 Unspecified dementia, mild, without behavioral disturbance, psychotic disturbance, mood disturbance, and anxiety: Secondary | ICD-10-CM

## 2024-05-18 DIAGNOSIS — E785 Hyperlipidemia, unspecified: Secondary | ICD-10-CM | POA: Diagnosis not present

## 2024-05-18 DIAGNOSIS — D329 Benign neoplasm of meninges, unspecified: Secondary | ICD-10-CM

## 2024-05-18 DIAGNOSIS — I10 Essential (primary) hypertension: Secondary | ICD-10-CM

## 2024-05-18 MED ORDER — DONEPEZIL HCL 10 MG PO TABS: 10.0000 mg | ORAL_TABLET | Freq: Every day | ORAL | 1 refills | Status: AC

## 2024-05-18 MED ORDER — ALENDRONATE SODIUM 70 MG PO TABS: ORAL_TABLET | ORAL | 0 refills | Status: AC

## 2024-05-18 MED ORDER — LOSARTAN POTASSIUM 100 MG PO TABS
100.0000 mg | ORAL_TABLET | Freq: Every day | ORAL | 0 refills | Status: AC
Start: 2024-05-18 — End: ?

## 2024-05-18 MED ORDER — ROSUVASTATIN CALCIUM 10 MG PO TABS: 10.0000 mg | ORAL_TABLET | Freq: Every day | ORAL | 0 refills | Status: AC

## 2024-05-18 MED ORDER — MEMANTINE HCL 5 MG PO TABS: 5.0000 mg | ORAL_TABLET | Freq: Two times a day (BID) | ORAL | 2 refills | Status: DC

## 2024-05-18 MED ORDER — MEMANTINE HCL 10 MG PO TABS
10.0000 mg | ORAL_TABLET | Freq: Two times a day (BID) | ORAL | 2 refills | Status: AC
Start: 2024-05-18 — End: ?

## 2024-05-18 MED ORDER — AMLODIPINE BESYLATE 10 MG PO TABS: 10.0000 mg | ORAL_TABLET | Freq: Every day | ORAL | 1 refills | Status: AC

## 2024-05-18 NOTE — Progress Notes (Signed)
 Subjective:    Patient ID: Kristina Whitaker, female    DOB: 09-Sep-1950, 73 y.o.   MRN: [id number]  Chief Complaint  Patient presents with   Medical Management of Chronic Issues    Pt presents to the office today for chronic follow up.  She is followed by Neurosurgeon once a year for meningioma.    She has meningioma and had this removed on 12/04/16. Reports she was having similar memory problems before she had this removed and worried. She had MRI for 07/13/22 that, No acute or reversible finding. Mild chronic small-vessel ischemic change of the cerebral hemispheric white matter.   She has osteoporosis and was prescribed Fosamax  weekly.  She is not taking calcium  or vit D. Her last dexa scan was 02/16/23.  Has dementia and takes aricept  and namenda . Her husband helps with her medications.    She continues to refuse all labs. Has not had labs drawn since 2022.  Hypertension This is a chronic problem. The current episode started more than 1 year ago. The problem has been resolved since onset. The problem is controlled. Pertinent negatives include no malaise/fatigue, peripheral edema or shortness of breath. Risk factors for coronary artery disease include sedentary lifestyle, obesity and dyslipidemia. The current treatment provides moderate improvement.  Hyperlipidemia This is a chronic problem. The current episode started more than 1 year ago. The problem is uncontrolled. Pertinent negatives include no shortness of breath. Current antihyperlipidemic treatment includes statins. The current treatment provides moderate improvement of lipids. Risk factors for coronary artery disease include dyslipidemia, hypertension, a sedentary lifestyle, female sex and post-menopausal.      Review of Systems  Constitutional:  Negative for malaise/fatigue.  Respiratory:  Negative for shortness of breath.   All other systems reviewed and are negative.  Family History  Problem Relation Age of Onset   Heart  attack Sister    Stroke Sister    Heart attack Maternal Aunt    Heart attack Paternal Uncle    Stroke Father    Heart attack Father    Heart attack Brother    Social History   Socioeconomic History   Marital status: Married    Spouse name: Todd   Number of children: 1   Years of education: 16   Highest education level: Not on file  Occupational History   Occupation: retired    Comment: Art gallery manager  Tobacco Use   Smoking status: Never   Smokeless tobacco: Never  Vaping Use   Vaping status: Not on file  Substance and Sexual Activity   Alcohol use: Not Currently    Alcohol/week: 1.0 standard drink of alcohol    Types: 1 Glasses of wine per week    Comment: occasional glass of wine   Drug use: No   Sexual activity: Yes  Other Topics Concern   Not on file  Social History Narrative   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    She lives at home with her husband Todd.    They have one son that lives [place].    Cyndia enjoys traveling.    She has an outside cat and an inside Armed forces logistics/support/administrative officer.    Social Drivers of Corporate investment banker Strain: Low Risk  (03/21/2024)   Overall Financial Resource Strain (CARDIA)    Difficulty of Paying Living Expenses: Not hard at all  Food Insecurity: No Food Insecurity (03/21/2024)   Hunger Vital Sign    Worried About Running Out of Food in the Last Year:  Never true    Ran Out of Food in the Last Year: Never true  Transportation Needs: No Transportation Needs (03/21/2024)   PRAPARE - Administrator, Civil Service (Medical): No    Lack of Transportation (Non-Medical): No  Physical Activity: Inactive (03/21/2024)   Exercise Vital Sign    Days of Exercise per Week: 0 days    Minutes of Exercise per Session: 0 min  Stress: No Stress Concern Present (03/21/2024)   [employer] of Occupational Health - Occupational Stress Questionnaire    Feeling of Stress: Not at all  Social Connections: Moderately Integrated (03/21/2024)   Social Connection  and Isolation Panel    Frequency of Communication with Friends and Family: More than three times a week    Frequency of Social Gatherings with Friends and Family: More than three times a week    Attends Religious Services: Never    Database administrator or Organizations: Yes    Attends Banker Meetings: Never    Marital Status: Married       Objective:   Physical Exam Vitals reviewed.  Constitutional:      General: She is not in acute distress.    Appearance: She is well-developed.  HENT:     Head: Normocephalic and atraumatic.     Right Ear: Tympanic membrane normal.     Left Ear: Tympanic membrane normal.  Eyes:     Pupils: Pupils are equal, round, and reactive to light.  Neck:     Thyroid: No thyromegaly.  Cardiovascular:     Rate and Rhythm: Normal rate and regular rhythm.     Heart sounds: Normal heart sounds. No murmur heard. Pulmonary:     Effort: Pulmonary effort is normal. No respiratory distress.     Breath sounds: Normal breath sounds. No wheezing.  Abdominal:     General: Bowel sounds are normal. There is no distension.     Palpations: Abdomen is soft.     Tenderness: There is no abdominal tenderness.  Musculoskeletal:        General: No tenderness. Normal range of motion.     Cervical back: Normal range of motion and neck supple.  Skin:    General: Skin is warm and dry.  Neurological:     Mental Status: She is alert and oriented to person, place, and time.     Cranial Nerves: No cranial nerve deficit.     Deep Tendon Reflexes: Reflexes are normal and symmetric.  Psychiatric:        Behavior: Behavior normal.        Thought Content: Thought content normal.        Judgment: Judgment normal.     Ht 5' 1 (1.549 m)   Wt 132 lb (59.9 kg)   BMI 24.94 kg/m        Assessment & Plan:  MYRIKAL MESSMER comes in today with chief complaint of Medical Management of Chronic Issues   Diagnosis and orders addressed:  1. Primary hypertension  (Primary) - amLODipine  (NORVASC ) 10 MG tablet; Take 1 tablet (10 mg total) by mouth daily.  Dispense: 90 tablet; Refill: 1 - losartan  (COZAAR ) 100 MG tablet; Take 1 tablet (100 mg total) by mouth daily.  Dispense: 90 tablet; Refill: 0  2. Mild dementia without behavioral disturbance, psychotic disturbance, mood disturbance, or anxiety, unspecified dementia type (HCC) - donepezil  (ARICEPT ) 10 MG tablet; Take 1 tablet (10 mg total) by mouth at bedtime.  Dispense: 90 tablet;  Refill: 1 - memantine  (NAMENDA ) 10 MG tablet; Take 1 tablet (10 mg total) by mouth 2 (two) times daily.  Dispense: 180 tablet; Refill: 2  3. Osteoporosis, unspecified osteoporosis type, unspecified pathological fracture presence - alendronate  (FOSAMAX ) 70 MG tablet; TAKE 1 TABLET ON EMPTY STOMACH WITH FULL GLASS OF WATER EVERY 7 DAYS  Dispense: 12 tablet; Refill: 0  4. Hyperlipidemia, unspecified hyperlipidemia type  - rosuvastatin  (CRESTOR ) 10 MG tablet; Take 1 tablet (10 mg total) by mouth daily.  Dispense: 90 tablet; Refill: 0  5. Meningioma [facility])   Labs refused  Discussed in length the importance of getting labs. She continues to refused. Discussed that I will not be able to continue to refill her medications without labs.  Health Maintenance reviewed Diet and exercise encouraged  Follow up plan: 4 months    Bari Learn, FNP

## 2024-05-18 NOTE — Patient Instructions (Signed)

## 2025-03-22 ENCOUNTER — Ambulatory Visit: Payer: Self-pay
